# Patient Record
Sex: Female | Born: 1980 | Race: Black or African American | Hispanic: No | Marital: Single | Smoking: Current every day smoker
Health system: Southern US, Community
[De-identification: ages and names within clinical notes are randomized; demographics above are authoritative.]

---

## 2004-05-03 ENCOUNTER — Emergency Department (HOSPITAL_COMMUNITY): Admission: EM | Admit: 2004-05-03 | Discharge: 2004-05-03 | Payer: Self-pay | Admitting: Emergency Medicine

## 2004-05-11 ENCOUNTER — Emergency Department (HOSPITAL_COMMUNITY): Admission: EM | Admit: 2004-05-11 | Discharge: 2004-05-11 | Payer: Self-pay | Admitting: Emergency Medicine

## 2005-06-08 ENCOUNTER — Ambulatory Visit (HOSPITAL_COMMUNITY): Admission: RE | Admit: 2005-06-08 | Discharge: 2005-06-08 | Payer: Self-pay | Admitting: *Deleted

## 2005-07-28 ENCOUNTER — Ambulatory Visit (HOSPITAL_COMMUNITY): Admission: RE | Admit: 2005-07-28 | Discharge: 2005-07-28 | Payer: Self-pay | Admitting: *Deleted

## 2005-10-26 ENCOUNTER — Ambulatory Visit (HOSPITAL_COMMUNITY): Admission: AD | Admit: 2005-10-26 | Discharge: 2005-10-26 | Payer: Self-pay | Admitting: *Deleted

## 2005-10-28 ENCOUNTER — Inpatient Hospital Stay (HOSPITAL_COMMUNITY): Admission: AD | Admit: 2005-10-28 | Discharge: 2005-10-30 | Payer: Self-pay | Admitting: *Deleted

## 2008-08-28 ENCOUNTER — Other Ambulatory Visit: Admission: RE | Admit: 2008-08-28 | Discharge: 2008-08-28 | Payer: Self-pay | Admitting: Obstetrics and Gynecology

## 2010-09-05 ENCOUNTER — Emergency Department (HOSPITAL_COMMUNITY): Admission: EM | Admit: 2010-09-05 | Discharge: 2010-09-05 | Payer: Self-pay | Admitting: Emergency Medicine

## 2010-12-10 ENCOUNTER — Encounter: Payer: Self-pay | Admitting: *Deleted

## 2011-04-07 NOTE — Op Note (Signed)
Martha Howe, Martha Howe                 ACCOUNT NO.:  [id number]   MEDICAL RECORD NO.:  [id number]          PATIENT TYPE:  INP   LOCATION:  A401                          FACILITY:  [facility]   PHYSICIAN:  Tilda Burrow, M.D. DATE OF BIRTH:  [birth year]   DATE OF PROCEDURE:  10/28/2005  DATE OF DISCHARGE:                                 OPERATIVE REPORT   DELIVERY NOTE:  Patient progressed promptly to completely dilated.  She  pushed well, had initially some variable decelerations associated with  delivery but moved quickly to spontaneous expulsion of the vertex.  The  infant was delivered over an intact perineum, right arm delivered first and  then the infant placed on maternal abdomen.  Cord was clamped and cut near  the baby.  The baby was then transferred to the warmer at less than 1 minute  of age.  The baby initially showed good tone but while placed on the warmer  the baby's overall tone and respiratory effort diminished.  The baby had a 1  minute Apgar of 3 assigned and required positive pressure ventilation to  assist with efforts.  The baby had a 5 minute Apgar of 8 and at 10 minutes  Apgar was 9.  The baby did not require chest compressions.  During the time  of initial decline in neonatal status we called a Code Apgar to have  assistance from Emergency Room physician and Respiratory.  The baby  responded adequately to bag mask ventilation that no further efforts were  necessary.  He was continued on blow-by oxygen.  Dr. Milford Cage came in to assess,  see his notes for further details.  The mother had had the placenta  delivered promptly within 1 minute of delivery, intact __schultz  presentation, blood gases pending at the time of this dictation.  Cord blood  sample will be obtained.   ADDENDUM:  The baby's response to resuscitative efforts corresponded to  approximately a minute after receiving neonatal Narcan dose.      Tilda Burrow, M.D.  Electronically Signed     JVF/MEDQ  D:  10/28/2005  T:  10/29/2005  Job:  [id number]   cc:   Francoise Schaumann. Halford Chessman  Fax: [phone]   Langley Gauss, MD  Fax: [phone]

## 2011-04-07 NOTE — Discharge Summary (Signed)
Martha Howe, TRAUGER                 ACCOUNT NO.:  [id number]   MEDICAL RECORD NO.:  [id number]          PATIENT TYPE:  INP   LOCATION:  A401                          FACILITY:  [facility]   PHYSICIAN:  Langley Gauss, MD     DATE OF BIRTH:  1980/12/22   DATE OF ADMISSION:  10/28/2005  DATE OF DISCHARGE:  12/11/2006LH                                 DISCHARGE SUMMARY   PROCEDURES:  Patient admitted in cross coverage by Dr. Christin Bach.  Vaginal delivery performed by Dr. Christin Bach on October 28, 2005.  Infant  circumcision performed by Dr.  Langley Gauss on October 30, 2005.  Discharge services per Dr. Langley Gauss on October 30, 2005.   ADDITIONAL PROCEDURES:  The patient declined epidural, received only IV  narcotics during the course of labor.   SUMMARY:  The patient is breast feeding at the time of discharge.  Given a  copy of standardized discharge instructions.  Follow up in the office in  four weeks time for continued care.   DISCHARGE MEDICATIONS:  1.  Tylenol #3.  2.  Motrin.   PERTINENT LABORATORY DATA:  GBS carrier status negative.  Admission  hemoglobin/hematocrit 13.4/39.6 with white count of 18.4.  This was  subsequently repeated postpartum.  Hemoglobin 12.4, hematocrit 36.3 with a  white count of 22.7.  On day of discharge, October 30, 2005, hemoglobin  12.0, hematocrit 34.8, white count 15.5.  O positive blood type.   HOSPITAL COURSE:  See previous dictations.  I had planned on delivering the  patient on October 26, 2005.  However, due to staffing limitations, this was  not possible, and the patient was discharged to home.  She subsequently  returned to [facility] on October 28, 2005, in early stages of  active labor.  Labor and delivery managed by Dr. Christin Bach.  The patient  had no postpartum complications.  Was discharged home on October 30, 2005.  Infant circumcision performed by Dr. Lisette Grinder on October 30, 2005.  Dictated  separately on  same day as service.  Payments had been received prior to  performance of the said procedure in the office.      Langley Gauss, MD  Electronically Signed     DC/MEDQ  D:  10/30/2005  T:  10/31/2005  Job:  [id number]

## 2011-04-07 NOTE — Group Therapy Note (Signed)
Martha Howe, Martha Howe                 ACCOUNT NO.:  [id number]   MEDICAL RECORD NO.:  [id number]          PATIENT TYPE:  INP   LOCATION:  LDR1                          FACILITY:  [facility]   PHYSICIAN:  Tilda Burrow, M.D. DATE OF BIRTH:  [birth year]   DATE OF PROCEDURE:  DATE OF DISCHARGE:                                   PROGRESS NOTE   INTERIM NOTE:  At 3:30 p.m. the patient has progressed 8-cm dilated,  complete effaced, +2 station.  Member rupture shows clear amniotic fluid.  Fetal heart rate tracing is reactive.  The patient has multiple family  members with her and she is completely okay with this.  Amniotomy reveals  clear amniotic fluid.  Prognosis for vaginal delivery is considered  excellent.      Tilda Burrow, M.D.  Electronically Signed     JVF/MEDQ  D:  10/28/2005  T:  10/28/2005  Job:  [id number]

## 2011-04-07 NOTE — H&P (Signed)
Martha Howe, AVIS                 ACCOUNT NO.:  [id number]   MEDICAL RECORD NO.:  [id number]          PATIENT TYPE:  INP   LOCATION:  LDR2                          FACILITY:  [facility]   PHYSICIAN:  Langley Gauss, MD     DATE OF BIRTH:  11/24/1980   DATE OF ADMISSION:  10/26/2005  DATE OF DISCHARGE:  12/07/2006LH                                HISTORY & PHYSICAL   HISTORY OF PRESENT ILLNESS:  A 30 year old, G1, P0 with an EDC of November 05, 2005, who is seen in the office today on two occasions complaining of  increasing frequency and intensity of uterine contractions.  The patient has  been examined x2 in the office with some cervical change.  She is noted to  be 3 cm dilated, completely effaced, vertex at 0 station and well-applied to  the cervix.  She does complain of uterine contractions every 5-10 minutes.   PRENATAL COURSE:  Complicated by early findings per [facility] sonogram of  July 21, 2005, and was felt to be polyhydramnios, however, subsequent  targeted ultrasound at [facility] on August 25, 2005, revealed normal amniotic  fluid volume.  The remainder of anatomic survey was within normal limits.  She did have a right ovarian cyst present during first trimester ultrasounds  which subsequently resolved.  The patient was noted to have trichomonas on  initial Pap smear and this was treated during the second trimester.  She  otherwise has had serial ultrasounds which documents adequate fetal growth.  She smoked only about 3 cigarettes per day with O positive blood type,  antibody screen negative.  One-hour GTT is normal at 86.   SOCIAL HISTORY:  The patient is employed at [employer].  Father of the  baby, Ventura Sellers also employed at [employer].   PAST MEDICAL HISTORY:  No other medical or surgical history.  She does plan  on breast-feeding.  She will be utilizing pediatrician on call.   PHYSICAL EXAMINATION:  GENERAL:  She is noted to be in mild distress with  uterine contractions.  VITAL SIGNS:  Height 5 feet 6 inches.  Pregnancy weight 135.  Most recent  weight 179.  Blood pressure 119/74, pulse rate 80, respirations 20.  HEENT:  Negative.  No adenopathy.  NECK:  Supple.  Thyroid is nonpalpable.  LUNGS:  Clear.  CARDIAC:  Regular rate and rhythm.  ABDOMEN:  Soft and nontender.  Gravid uterus is identified with a fundal  height of 36 cm.  She is vertex presentation by Thayer Ohm maneuver.  EXTREMITIES:  Normal.  PELVIC:  Normal external genitalia.  No lesions or ulcerations identified.  No vaginal bleeding.  No leakage of fluid.  Cervix is 3 cm dilated,  completely effaced.  Vertex at a 0 station and well-applied.  Fetal heart  tones auscultated in the 150s.  GBS carrier status is noted to be negative.   ASSESSMENT/PLAN:  The patient in probable early stages of labor.  She is  referred to [facility] for monitoring for either cervical change or  for increasing frequency in intensity of  uterine contractions.      Langley Gauss, MD  Electronically Signed     DC/MEDQ  D:  10/26/2005  T:  10/26/2005  Job:  [id number]

## 2011-04-07 NOTE — H&P (Signed)
Martha Howe, Martha Howe                 ACCOUNT NO.:  [id number]   MEDICAL RECORD NO.:  [id number]          PATIENT TYPE:  INP   LOCATION:  LDR1                          FACILITY:  [facility]   PHYSICIAN:  Tilda Burrow, M.D. DATE OF BIRTH:  Apr 06, 1981   DATE OF ADMISSION:  10/28/2005  DATE OF DISCHARGE:  LH                                HISTORY & PHYSICAL   ADMISSION DIAGNOSIS:  Pregnancy, 38+ weeks gestation, latent phase labor.   HISTORY OF PRESENT ILLNESS:  This 30 year old primiparous female, last  menstrual period _________, now at 38-1/[redacted] weeks gestation by consensus  criteria is admitted after pregnancy course followed through Dr. Preston Fleeting  office. She was [facility]  at [redacted] weeks gestation where fetal survey was normal with detailed survey  performed with no outstanding abnormalities noted. She had normal amniotic  fluid volume with anterior placenta. She had MSAFP for Down syndrome risk  1:2,100, group B strep negative, glucose tolerance test normal at 86 mg/%.  Female infant. She was originally going to be scheduled for elective induction  next week. She is admitted for labor after presenting with discomfort  through the night. No rupture of membranes or bleeding with cervical exam of  3 cm, 100% effaced, -1 station by nursing evaluation. She is admitted for  labor and management.   PAST MEDICAL HISTORY:  See accompanying prenatal records.   ALLERGIES:  No known drug allergies.   PHYSICAL EXAMINATION:  GENERAL:  Reveals a tall, slim, African-American  female, alert and oriented x3.  HEENT:  Pupils are equal, round, and reactive. Extraocular movements intact.  NECK:  Supple. Trachea midline.  ABDOMEN:  Nontender, gravid uterus, appropriate for term gestation.  CERVIX:  3 cm, 100%, -1 by nursing evaluation.   PLAN:  Allow to labor. May require Pitocin augmentation. Good prognosis for  vaginal delivery.      Tilda Burrow,  M.D.  Electronically Signed     JVF/MEDQ  D:  10/28/2005  T:  10/28/2005  Job:  [id number]   cc:   [facility]

## 2013-05-23 ENCOUNTER — Encounter (HOSPITAL_COMMUNITY): Payer: Self-pay

## 2013-05-23 ENCOUNTER — Emergency Department (HOSPITAL_COMMUNITY): Payer: Medicaid Other

## 2013-05-23 ENCOUNTER — Emergency Department (HOSPITAL_COMMUNITY)
Admission: EM | Admit: 2013-05-23 | Discharge: 2013-05-23 | Disposition: A | Discharge status: Home / Self Care | Payer: Medicaid Other | Attending: Emergency Medicine | Admitting: Emergency Medicine

## 2013-05-23 DIAGNOSIS — F17200 Nicotine dependence, unspecified, uncomplicated: Secondary | ICD-10-CM | POA: Insufficient documentation

## 2013-05-23 DIAGNOSIS — IMO0002 Reserved for concepts with insufficient information to code with codable children: Secondary | ICD-10-CM | POA: Insufficient documentation

## 2013-05-23 DIAGNOSIS — R319 Hematuria, unspecified: Secondary | ICD-10-CM | POA: Insufficient documentation

## 2013-05-23 DIAGNOSIS — N390 Urinary tract infection, site not specified: Secondary | ICD-10-CM | POA: Insufficient documentation

## 2013-05-23 DIAGNOSIS — R300 Dysuria: Secondary | ICD-10-CM | POA: Insufficient documentation

## 2013-05-23 DIAGNOSIS — Z3202 Encounter for pregnancy test, result negative: Secondary | ICD-10-CM | POA: Insufficient documentation

## 2013-05-23 DIAGNOSIS — R350 Frequency of micturition: Secondary | ICD-10-CM | POA: Insufficient documentation

## 2013-05-23 LAB — PREGNANCY, URINE: Preg Test, Ur: NEGATIVE

## 2013-05-23 LAB — URINE MICROSCOPIC-ADD ON

## 2013-05-23 LAB — URINALYSIS, ROUTINE W REFLEX MICROSCOPIC
Bilirubin Urine: NEGATIVE
Glucose, UA: NEGATIVE mg/dL
Leukocytes, UA: NEGATIVE
Nitrite: NEGATIVE
Specific Gravity, Urine: 1.025 (ref 1.005–1.030)
Urobilinogen, UA: 1 mg/dL (ref 0.0–1.0)
pH: 6.5 (ref 5.0–8.0)

## 2013-05-23 MED ORDER — PHENAZOPYRIDINE HCL 100 MG PO TABS
200.0000 mg | ORAL_TABLET | Freq: Once | ORAL | Status: AC
  Administered 2013-05-23: 200 mg via ORAL
  Filled 2013-05-23: qty 2

## 2013-05-23 MED ORDER — CEPHALEXIN 500 MG PO CAPS: 500.0000 mg | ORAL_CAPSULE | Freq: Four times a day (QID) | ORAL | Status: DC

## 2013-05-23 MED ORDER — CEPHALEXIN 500 MG PO CAPS
500.0000 mg | ORAL_CAPSULE | Freq: Once | ORAL | Status: AC
  Administered 2013-05-23: 500 mg via ORAL
  Filled 2013-05-23: qty 1

## 2013-05-23 MED ORDER — PHENAZOPYRIDINE HCL 200 MG PO TABS: 200.0000 mg | ORAL_TABLET | Freq: Three times a day (TID) | ORAL | Status: DC

## 2013-05-23 NOTE — ED Notes (Signed)
Pt was on depo injections, due for injection April 4th and did not get another, has not had a regular period since that time. She has been sexually active.

## 2013-05-23 NOTE — ED Provider Notes (Signed)
History  This chart was scribed for Lyanne Co, MD by Bennett Scrape, ED Scribe. This patient was seen in room APA17/APA17 and the patient's care was started at 9:09 AM.  CSN: [id number]  Arrival date & time 05/23/13  0905   First MD Initiated Contact with Patient 05/23/13 [phone]     Chief Complaint  Patient presents with  . Urinary Tract Infection    The history is provided by the patient. No language interpreter was used.    HPI Comments: Martha Howe is a 32 y.o. female who presents to the Emergency Department complaining of 2 days of lower back pain that radiates to the suprapubic region with associated increased frequency, hematuria described as light pink, dyspareunia and dysuria described as pressure. She states that the urinary symptoms started before she had intercourse. She denies having a h/o UTIs or prior episodes of the same. She denies fever, chills, nausea, emesis, vaginal lesions and vaginal bleeding or discharge as associated symptoms.  History reviewed. No pertinent past medical history.  History reviewed. No pertinent past surgical history.  No family history on file.  History  Substance Use Topics  . Smoking status: Current Every Day Smoker    Types: Cigarettes  . Smokeless tobacco: Not on file  . Alcohol Use: Yes     Comment: occ   No OB history provided.   Review of Systems  A complete 10 system review of systems was obtained and all systems are negative except as noted in the HPI and PMH.   Allergies  Review of patient's allergies indicates not on file.  Home Medications  No current outpatient prescriptions on file.  LMP 04/21/2013  Physical Exam  Nursing note and vitals reviewed. Constitutional: She is oriented to person, place, and time. She appears well-developed and well-nourished. No distress.  HENT:  Head: Normocephalic and atraumatic.  Eyes: Conjunctivae and EOM are normal.  Neck: Normal range of motion. No tracheal deviation  present.  Cardiovascular: Normal rate, regular rhythm and normal heart sounds.   No murmur heard. Pulmonary/Chest: Effort normal and breath sounds normal.  Abdominal: Soft. She exhibits no distension. There is no tenderness.  Musculoskeletal: Normal range of motion.  Neurological: She is alert and oriented to person, place, and time.  Skin: Skin is warm and dry.  Psychiatric: She has a normal mood and affect. Judgment normal.    ED Course  Procedures (including critical care time)  DIAGNOSTIC STUDIES: None performed  COORDINATION OF CARE: 9:22 AM-Discussed treatment plan which includes pyridium and antibiotics with pt at bedside and pt agreed to plan.   Labs Reviewed  URINALYSIS, ROUTINE W REFLEX MICROSCOPIC - Abnormal; Notable for the following:    Hgb urine dipstick MODERATE (*)    Ketones, ur TRACE (*)    Protein, ur TRACE (*)    All other components within normal limits  URINE MICROSCOPIC-ADD ON - Abnormal; Notable for the following:    Squamous Epithelial / LPF MANY (*)    Bacteria, UA FEW (*)    Casts GRANULAR CAST (*)    All other components within normal limits  PREGNANCY, URINE   US Renal  05/23/2013   *RADIOLOGY REPORT*  Clinical Data:  Urinary tract infection  RENAL/URINARY TRACT ULTRASOUND COMPLETE  Comparison:  Obstetrical ultrasound 07/28/2005  Findings:  Right Kidney:  Normal in size (12.1 cm) and parenchymal echogenicity.  No evidence of mass or hydronephrosis.  Left Kidney:  Normal in size (12.8 cm) and parenchymal echogenicity.  No  evidence of mass or hydronephrosis.  Bladder:  Not well seen secondary to under distension.  IMPRESSION:  1.  Normal sonographic appearance of the kidneys. 2.  The bladder is not well seen secondary to under distension.   Original Report Authenticated By: Malachy Moan, M.D.   1. Urinary frequency   2. Hematuria      MDM  Given the patient's hematuria and ultrasound was obtained because demonstrates no evidence of  hydronephrosis or obvious ureteral stone.  Her symptoms sound more consistent with urinary tract infection therefore urine culture was sent the patient be placed on Keflex for 5 days  I personally performed the services described in this documentation, which was scribed in my presence. The recorded information has been reviewed and is accurate.      Lyanne Co, MD 05/23/13 [phone]

## 2013-05-23 NOTE — ED Notes (Signed)
Pt reports low back pain, freq. Urination, and blood in her urine for 2 days, no vaginal discharge, no fever, no nausea.  No having bil lower ab pain.

## 2013-07-25 ENCOUNTER — Encounter (HOSPITAL_COMMUNITY): Payer: Self-pay | Admitting: Emergency Medicine

## 2013-07-25 ENCOUNTER — Emergency Department (HOSPITAL_COMMUNITY)
Admission: EM | Admit: 2013-07-25 | Discharge: 2013-07-25 | Disposition: A | Discharge status: Home / Self Care | Payer: Medicaid Other | Attending: Emergency Medicine | Admitting: Emergency Medicine

## 2013-07-25 DIAGNOSIS — B9689 Other specified bacterial agents as the cause of diseases classified elsewhere: Secondary | ICD-10-CM | POA: Insufficient documentation

## 2013-07-25 DIAGNOSIS — A499 Bacterial infection, unspecified: Secondary | ICD-10-CM | POA: Insufficient documentation

## 2013-07-25 DIAGNOSIS — N760 Acute vaginitis: Secondary | ICD-10-CM | POA: Insufficient documentation

## 2013-07-25 DIAGNOSIS — N898 Other specified noninflammatory disorders of vagina: Secondary | ICD-10-CM | POA: Insufficient documentation

## 2013-07-25 DIAGNOSIS — F17200 Nicotine dependence, unspecified, uncomplicated: Secondary | ICD-10-CM | POA: Insufficient documentation

## 2013-07-25 LAB — WET PREP, GENITAL: Trich, Wet Prep: NONE SEEN

## 2013-07-25 LAB — URINALYSIS, ROUTINE W REFLEX MICROSCOPIC
Bilirubin Urine: NEGATIVE
Glucose, UA: NEGATIVE mg/dL
Protein, ur: NEGATIVE mg/dL
pH: 6 (ref 5.0–8.0)

## 2013-07-25 LAB — RPR: RPR Ser Ql: NONREACTIVE

## 2013-07-25 MED ORDER — METRONIDAZOLE 500 MG PO TABS: 500.0000 mg | ORAL_TABLET | Freq: Two times a day (BID) | ORAL | Status: DC

## 2013-07-25 MED ORDER — HYDROCODONE-ACETAMINOPHEN 5-325 MG PO TABS: 1.0000 | ORAL_TABLET | ORAL | Status: DC | PRN

## 2013-07-25 NOTE — ED Notes (Signed)
Pt c/o vaginal itching/burning x 3 days. Would like to be tested for STD also. C/o white cloudy vag d/c. Took monostat 3. Yesterday was 2nd day. nad

## 2013-07-25 NOTE — ED Notes (Signed)
Patient with no complaints at this time. Respirations even and unlabored. Skin warm/dry. Discharge instructions reviewed with patient at this time. Patient given opportunity to voice concerns/ask questions. Patient discharged at this time and left Emergency Department with steady gait.   

## 2013-07-25 NOTE — ED Provider Notes (Signed)
CSN: [id number]     Arrival date & time 07/25/13  4098 History   First MD Initiated Contact with Patient 07/25/13 [phone]     Chief Complaint  Patient presents with  . Vaginal Itching   (Consider location/radiation/quality/duration/timing/severity/associated sxs/prior Treatment) Patient is a 32 y.o. female presenting with vaginal itching. The history is provided by the patient.  Vaginal Itching This is a new problem. The current episode started in the past 7 days. The problem occurs daily. The problem has been gradually worsening. Pertinent negatives include no abdominal pain, arthralgias, chest pain, coughing, fever or neck pain. Nothing aggravates the symptoms. Treatments tried: monistat. The treatment provided no relief.    History reviewed. No pertinent past medical history. History reviewed. No pertinent past surgical history. History reviewed. No pertinent family history. History  Substance Use Topics  . Smoking status: Current Every Day Smoker    Types: Cigarettes  . Smokeless tobacco: Not on file  . Alcohol Use: Yes     Comment: occ   OB History   Grav Para Term Preterm Abortions TAB SAB Ect Mult Living                 Review of Systems  Constitutional: Negative for fever and activity change.       All ROS Neg except as noted in HPI  HENT: Negative for nosebleeds and neck pain.   Eyes: Negative for photophobia and discharge.  Respiratory: Negative for cough, shortness of breath and wheezing.   Cardiovascular: Negative for chest pain and palpitations.  Gastrointestinal: Negative for abdominal pain and blood in stool.  Genitourinary: Positive for vaginal discharge. Negative for dysuria, frequency, hematuria, decreased urine volume and difficulty urinating.  Musculoskeletal: Negative for back pain and arthralgias.  Skin: Negative.   Neurological: Negative for dizziness, seizures and speech difficulty.  Psychiatric/Behavioral: Negative for hallucinations and confusion.     Allergies  Review of patient's allergies indicates no known allergies.  Home Medications   Current Outpatient Rx  Name  Route  Sig  Dispense  Refill  . Miconazole Nitrate (MONISTAT 3 COMBINATION PACK VA)   Vaginal   Place 1 application vaginally See admin instructions. Takes for 3 days, pt will be taking last dose tonight 9/5          BP 104/61  Pulse 88  Temp(Src) 97.9 F (36.6 C) (Oral)  SpO2 98%  LMP 06/30/2013 Physical Exam  Nursing note and vitals reviewed. Constitutional: She is oriented to person, place, and time. She appears well-developed and well-nourished.  Non-toxic appearance.  HENT:  Head: Normocephalic.  Right Ear: Tympanic membrane and external ear normal.  Left Ear: Tympanic membrane and external ear normal.  Eyes: EOM and lids are normal. Pupils are equal, round, and reactive to light.  Neck: Normal range of motion. Neck supple. Carotid bruit is not present.  Cardiovascular: Normal rate, regular rhythm, normal heart sounds, intact distal pulses and normal pulses.   Pulmonary/Chest: Breath sounds normal. No respiratory distress.  Abdominal: Soft. Bowel sounds are normal. There is no tenderness. There is no guarding.  Genitourinary:  Chaperone present during examination. No external lesions of the pubis or external vagina. There is increased redness and swelling of the internal labia and to the entrance of the vagina. There is no foreign body appreciated. There is noted some residue from the Monistat that the patient has been using. There is a white milky  discharge present. The os of the cervix is closed there is increase redness present.  There is no wall motion tenderness. There is no adnexal tenderness or mass appreciated.  Musculoskeletal: Normal range of motion.  Lymphadenopathy:       Head (right side): No submandibular adenopathy present.       Head (left side): No submandibular adenopathy present.    She has no cervical adenopathy.  Neurological:  She is alert and oriented to person, place, and time. She has normal strength. No cranial nerve deficit or sensory deficit.  Skin: Skin is warm and dry.  Psychiatric: She has a normal mood and affect. Her speech is normal.    ED Course  Procedures (including critical care time) Labs Review Labs Reviewed  URINALYSIS, ROUTINE W REFLEX MICROSCOPIC - Abnormal; Notable for the following:    Hgb urine dipstick SMALL (*)    Ketones, ur TRACE (*)    Leukocytes, UA TRACE (*)    All other components within normal limits  URINE MICROSCOPIC-ADD ON   Imaging Review No results found.  MDM  No diagnosis found. *I have reviewed nursing notes, vital signs, and all appropriate lab and imaging results for this patient.** UA non-acute. Wet prep reveal many clue cell and few WBC's c/w bacterial vaginosis. Rx for flagyl given to the patient.  Kathie Dike, PA-C 07/28/13 1656

## 2013-07-26 LAB — GC/CHLAMYDIA PROBE AMP
CT Probe RNA: NEGATIVE
GC Probe RNA: NEGATIVE

## 2013-07-29 NOTE — ED Provider Notes (Signed)
Medical screening examination/treatment/procedure(s) were performed by non-physician practitioner and as supervising physician I was immediately available for consultation/collaboration.    [place] Bryngelson D Avianna Moynahan, MD 07/29/13 1149 

## 2014-06-08 ENCOUNTER — Encounter (HOSPITAL_COMMUNITY): Payer: Self-pay | Admitting: Emergency Medicine

## 2014-06-08 ENCOUNTER — Emergency Department (HOSPITAL_COMMUNITY)
Admission: EM | Admit: 2014-06-08 | Discharge: 2014-06-08 | Disposition: A | Discharge status: Home / Self Care | Payer: Medicaid Other | Attending: Emergency Medicine | Admitting: Emergency Medicine

## 2014-06-08 DIAGNOSIS — A499 Bacterial infection, unspecified: Secondary | ICD-10-CM | POA: Insufficient documentation

## 2014-06-08 DIAGNOSIS — O99330 Smoking (tobacco) complicating pregnancy, unspecified trimester: Secondary | ICD-10-CM | POA: Insufficient documentation

## 2014-06-08 DIAGNOSIS — A5901 Trichomonal vulvovaginitis: Secondary | ICD-10-CM | POA: Diagnosis not present

## 2014-06-08 DIAGNOSIS — O26859 Spotting complicating pregnancy, unspecified trimester: Secondary | ICD-10-CM | POA: Diagnosis present

## 2014-06-08 DIAGNOSIS — B9689 Other specified bacterial agents as the cause of diseases classified elsewhere: Secondary | ICD-10-CM | POA: Insufficient documentation

## 2014-06-08 DIAGNOSIS — N390 Urinary tract infection, site not specified: Secondary | ICD-10-CM | POA: Diagnosis not present

## 2014-06-08 DIAGNOSIS — N760 Acute vaginitis: Secondary | ICD-10-CM | POA: Diagnosis not present

## 2014-06-08 DIAGNOSIS — O2343 Unspecified infection of urinary tract in pregnancy, third trimester: Secondary | ICD-10-CM

## 2014-06-08 DIAGNOSIS — A599 Trichomoniasis, unspecified: Secondary | ICD-10-CM

## 2014-06-08 DIAGNOSIS — O2390 Unspecified genitourinary tract infection in pregnancy, unspecified trimester: Secondary | ICD-10-CM | POA: Diagnosis not present

## 2014-06-08 LAB — URINALYSIS, ROUTINE W REFLEX MICROSCOPIC
Bilirubin Urine: NEGATIVE
Glucose, UA: NEGATIVE mg/dL
Hgb urine dipstick: NEGATIVE
KETONES UR: NEGATIVE mg/dL
NITRITE: NEGATIVE
PH: 6 (ref 5.0–8.0)
SPECIFIC GRAVITY, URINE: 1.025 (ref 1.005–1.030)
UROBILINOGEN UA: 1 mg/dL (ref 0.0–1.0)

## 2014-06-08 LAB — WET PREP, GENITAL: Yeast Wet Prep HPF POC: NONE SEEN

## 2014-06-08 LAB — URINE MICROSCOPIC-ADD ON

## 2014-06-08 MED ORDER — AZITHROMYCIN 250 MG PO TABS
1000.0000 mg | ORAL_TABLET | Freq: Once | ORAL | Status: AC
Start: 2014-06-08 — End: 2014-06-08
  Administered 2014-06-08: 1000 mg via ORAL
  Filled 2014-06-08: qty 4

## 2014-06-08 MED ORDER — METRONIDAZOLE 500 MG PO TABS: ORAL_TABLET | ORAL | Status: DC

## 2014-06-08 MED ORDER — ONDANSETRON HCL 4 MG PO TABS
4.0000 mg | ORAL_TABLET | Freq: Once | ORAL | Status: AC
  Administered 2014-06-08: 4 mg via ORAL
  Filled 2014-06-08: qty 1

## 2014-06-08 MED ORDER — METRONIDAZOLE 500 MG PO TABS
500.0000 mg | ORAL_TABLET | Freq: Once | ORAL | Status: AC
  Administered 2014-06-08: 500 mg via ORAL
  Filled 2014-06-08: qty 1

## 2014-06-08 MED ORDER — STERILE WATER FOR INJECTION IJ SOLN
INTRAMUSCULAR | Status: AC
  Administered 2014-06-08: 10 mL
  Filled 2014-06-08: qty 10

## 2014-06-08 MED ORDER — CEFTRIAXONE SODIUM 250 MG IJ SOLR
250.0000 mg | Freq: Once | INTRAMUSCULAR | Status: AC
  Administered 2014-06-08: 250 mg via INTRAMUSCULAR
  Filled 2014-06-08: qty 250

## 2014-06-08 NOTE — Discharge Instructions (Signed)
Bacterial Vaginosis °Bacterial vaginosis is a vaginal infection that occurs when the normal balance of bacteria in the vagina is disrupted. It results from an overgrowth of certain bacteria. This is the most common vaginal infection in women of childbearing age. Treatment is important to prevent complications, especially in pregnant women, as it can cause a premature delivery. °CAUSES  °Bacterial vaginosis is caused by an increase in harmful bacteria that are normally present in smaller amounts in the vagina. Several different kinds of bacteria can cause bacterial vaginosis. However, the reason that the condition develops is not fully understood. °RISK FACTORS °Certain activities or behaviors can put you at an increased risk of developing bacterial vaginosis, including: °· Having a new sex partner or multiple sex partners. °· Douching. °· Using an intrauterine device (IUD) for contraception. °Women do not get bacterial vaginosis from toilet seats, bedding, swimming pools, or contact with objects around them. °SIGNS AND SYMPTOMS  °Some women with bacterial vaginosis have no signs or symptoms. Common symptoms include: °· Grey vaginal discharge. °· A fishlike odor with discharge, especially after sexual intercourse. °· Itching or burning of the vagina and vulva. °· Burning or pain with urination. °DIAGNOSIS  °Your health care provider will take a medical history and examine the vagina for signs of bacterial vaginosis. A sample of vaginal fluid may be taken. Your health care provider will look at this sample under a microscope to check for bacteria and abnormal cells. A vaginal pH test may also be done.  °TREATMENT  °Bacterial vaginosis may be treated with antibiotic medicines. These may be given in the form of a pill or a vaginal cream. A second round of antibiotics may be prescribed if the condition comes back after treatment.  °HOME CARE INSTRUCTIONS  °· Only take over-the-counter or prescription medicines as  directed by your health care provider. °· If antibiotic medicine was prescribed, take it as directed. Make sure you finish it even if you start to feel better. °· Do not have sex until treatment is completed. °· Tell all sexual partners that you have a vaginal infection. They should see their health care provider and be treated if they have problems, such as a mild rash or itching. °· Practice safe sex by using condoms and only having one sex partner. °SEEK MEDICAL CARE IF:  °· Your symptoms are not improving after 3 days of treatment. °· You have increased discharge or pain. °· You have a fever. °MAKE SURE YOU:  °· Understand these instructions. °· Will watch your condition. °· Will get help right away if you are not doing well or get worse. °FOR MORE INFORMATION  °Centers for Disease Control and Prevention, Division of STD Prevention: www.cdc.gov/std °American Sexual Health Association (ASHA): www.ashastd.org  °Document Released: 11/06/2005 Document Revised: 08/27/2013 Document Reviewed: 06/18/2013 °ExitCare® Patient Information ©2015 [employer]. This information is not intended to replace advice given to you by your health care provider. Make sure you discuss any questions you have with your health care provider. °Trichomoniasis °Trichomoniasis is an infection caused by an organism called Trichomonas. The infection can affect both women and men. In women, the outer female genitalia and the vagina are affected. In men, the penis is mainly affected, but the prostate and other reproductive organs can also be involved. Trichomoniasis is a sexually transmitted infection (STI) and is most often passed to another person through sexual contact.  °RISK FACTORS °· Having unprotected sexual intercourse. °· Having sexual intercourse with an infected partner. °SIGNS AND SYMPTOMS  °  Symptoms of trichomoniasis in women include: °· Abnormal gray-green frothy vaginal discharge. °· Itching and irritation of the  vagina. °· Itching and irritation of the area outside the vagina. °Symptoms of trichomoniasis in men include:  °· Penile discharge with or without pain. °· Pain during urination. This results from inflammation of the urethra. °DIAGNOSIS  °Trichomoniasis may be found during a Pap test or physical exam. Your health care provider may use one of the following methods to help diagnose this infection: °· Examining vaginal discharge under a microscope. For men, urethral discharge would be examined. °· Testing the pH of the vagina with a test tape. °· Using a vaginal swab test that checks for the Trichomonas organism. A test is available that provides results within a few minutes. °· Doing a culture test for the organism. This is not usually needed. °TREATMENT  °· You may be given medicine to fight the infection. Women should inform their health care provider if they could be or are pregnant. Some medicines used to treat the infection should not be taken during pregnancy. °· Your health care provider may recommend over-the-counter medicines or creams to decrease itching or irritation. °· Your sexual partner will need to be treated if infected. °HOME CARE INSTRUCTIONS  °· Take all medicine prescribed by your health care provider. °· Take over-the-counter medicine for itching or irritation as directed by your health care provider. °· Do not have sexual intercourse while you have the infection. °· Women should not douche or wear tampons while they have the infection. °· Discuss your infection with your partner. Your partner may have gotten the infection from you, or you may have gotten it from your partner. °· Have your sex partner get examined and treated if necessary. °· Practice safe, informed, and protected sex. °· See your health care provider for other STI testing. °SEEK MEDICAL CARE IF:  °· You still have symptoms after you finish your medicine. °· You develop abdominal pain. °· You have pain when you urinate. °· You  have bleeding after sexual intercourse. °· You develop a rash. °· Your medicine makes you sick or makes you throw up (vomit). °Document Released: 05/02/2001 Document Revised: 11/11/2013 Document Reviewed: 08/18/2013 °ExitCare® Patient Information ©2015 [employer]. This information is not intended to replace advice given to you by your health care provider. Make sure you discuss any questions you have with your health care provider. ° °

## 2014-06-08 NOTE — ED Provider Notes (Signed)
CSN: [id number]     Arrival date & time 06/08/14  1533 History   None   This chart was scribed for non-physician practitioner Ivery Quale, PA-C working with No att. providers found, by Andrew Au, ED Scribe. This patient was seen in room APFT22/APFT22 and the patient's care was started at 6:08 PM.  Chief Complaint  Patient presents with  . Vaginal Discharge   Patient is a 33 y.o. female presenting with vaginal discharge. The history is provided by the patient. No language interpreter was used.  Vaginal Discharge Associated symptoms: no dysuria and no fever    Martha Howe is a 33 y.o. female who is 24-[redacted] weeks pregnant presents to the Emergency Department complaining of yellow/ greenish foul smelling vaginal discharge that began 2 days ago. Pt reports irritation to vaginal area when using flushable wipes. Pt reports she called OBGYN who believes her symptoms are a bacterial infection. She reports due to GYN being far she decieded to be seen at the ED. She reports last GYN evaluation was the beginning of this month which was normal. Pt reports h/o of yeast infection and denies similar symptoms. Pt denies fever and change in urination.    History reviewed. No pertinent past medical history. History reviewed. No pertinent past surgical history. History reviewed. No pertinent family history. History  Substance Use Topics  . Smoking status: Current Every Day Smoker    Types: Cigarettes  . Smokeless tobacco: Not on file  . Alcohol Use: Yes     Comment: occ   OB History   Grav Para Term Preterm Abortions TAB SAB Ect Mult Living   1              Review of Systems  Constitutional: Negative for fever and chills.  Genitourinary: Positive for vaginal discharge. Negative for dysuria, urgency, hematuria, decreased urine volume and difficulty urinating.   Allergies  Review of patient's allergies indicates no known allergies.  Home Medications   Prior to Admission medications   Not on  File   Triage Vitals- BP 106/63  Pulse 111  Temp(Src) 98 F (36.7 C) (Oral)  Resp 16  Ht 5\' 7"  (1.702 m)  Wt 172 lb (78.019 kg)  BMI 26.93 kg/m2  SpO2 100%  LMP 06/30/2013 Physical Exam  Nursing note and vitals reviewed. Constitutional: She is oriented to person, place, and time. She appears well-developed and well-nourished. No distress.  HENT:  Head: Normocephalic and atraumatic.  Eyes: Conjunctivae and EOM are normal.  Neck: Neck supple.  Cardiovascular: Normal rate, regular rhythm and normal heart sounds.  Exam reveals no gallop and no friction rub.   No murmur heard. Pulmonary/Chest: Effort normal and breath sounds normal. No respiratory distress. She has no wheezes. She has no rales. She exhibits no tenderness.  Genitourinary:  Chaperon present. External structures within normal limits. No rash or fistula noted. No foreign body in vaginal vault. Thick yellow green discharge in vaginal vault and from the os of the cervix. No wall motion tenderness. No adnexal mass.  Small healing abscess of the right inner thigh. minimal drainage. No red streaking.   Musculoskeletal: Normal range of motion.  Neurological: She is alert and oriented to person, place, and time.  Skin: Skin is warm and dry.  Psychiatric: She has a normal mood and affect. Her behavior is normal.    ED Course  Procedures (including critical care time) DIAGNOSTIC STUDIES: Oxygen Saturation is 100% on RA, normal by my interpretation.    COORDINATION  OF CARE: 6:20 PM- Pt advised of plan for treatment which includesWet prep, GC chlamydia. UA and abscess culture sent to the lab.    Labs Review Labs Reviewed - No data to display  Imaging Review No results found.   EKG Interpretation None      MDM The heart rate is elevated at 111. The remainder the vital signs are well within normal limits. The pulse oximetry 100% on room air. Within normal limits by my interpretation. The wet prep shows evidence of  Trichomonas, bacterial vaginosis. The urinalysis reveals a urinary tract infection. The patient was treated in the emergency department with Rocephin, Zithromax, and Flagyl. Prescription for Flagyl given to the patient with instructions to take this medication with food, and to refrain from all alcohol related substances. Patient is to follow with GYN physician for additional evaluation and recheck. Patient also encouraged to have her partner evaluated. Patient encouraged to refrain from sexual activity until this issue has been resolved.    Final diagnoses:  None    I personally performed the services described in this documentation, which was scribed in my presence. The recorded information has been reviewed and is accurate.      Kathie DikeHobson M Ronda Kazmi, PA-C 06/10/14 [phone]

## 2014-06-08 NOTE — Progress Notes (Signed)
Spoke with StatisticianCrystal RN. Okay to fast track pt since she only has GYN complaints and no OB complaints.The FHR will need to be obtained by doppler before she leaves and she is to follow up with her [facility] this week.

## 2014-06-08 NOTE — ED Notes (Addendum)
Pt c/o of yellow/greenish foul smelling watery discharge constantly for last 2 days. Pt spoke with OB today, who informed her to come to ED. Patient is [redacted] weeks pregnant. Pt reports "normal" fetal activity.

## 2014-06-08 NOTE — Progress Notes (Signed)
Spoke with Dr. Erin FullingHarraway-Smith, [facility] Attending on call. Since the pt only has GYN complaints,she is agreeable to obtaining FHR by doppler, doing cultures, and then obtaining the FHR by doppler before she is d/c. The pt [facility]

## 2014-06-08 NOTE — Progress Notes (Signed)
Received call from [facility] RN, Crystal. Pt is 24-[redacted] weeks pregnant with c/o yellow-green vaginal d/c. No vag bleeding, leaking of fluid, or uc's per pt. FHR is 151 BPM by doppler. Pt is [facility] pt. Crystal says that they do not have access to a FM at this time and the PA wants to Fast Track the pt. States they will do a pelvic and cultures. I will call the OB attending to ask about the need to place the pt on the monitor.

## 2014-06-10 LAB — URINE CULTURE
CULTURE: NO GROWTH
Colony Count: NO GROWTH

## 2014-06-10 LAB — GC/CHLAMYDIA PROBE AMP
CT Probe RNA: NEGATIVE
GC Probe RNA: NEGATIVE

## 2014-06-10 NOTE — ED Provider Notes (Signed)
Medical screening examination/treatment/procedure(s) were performed by non-physician practitioner and as supervising physician I was immediately available for consultation/collaboration.   EKG Interpretation None        Alexea Blase F Jermie Hippe, MD 06/10/14 0943 

## 2014-06-11 LAB — CULTURE, ROUTINE-ABSCESS: GRAM STAIN: NONE SEEN

## 2014-06-12 NOTE — Progress Notes (Signed)
ED Antimicrobial Stewardship Positive Culture Follow Up   Christianne BorrowLavinia M Crittendon is an 33 y.o. female who [facility] with a chief complaint of  Chief Complaint  Patient presents with  . Vaginal Discharge    Recent Results (from the past 720 hour(s))  WET PREP, GENITAL     Status: Abnormal   Collection Time    06/08/14  7:24 PM      Result Value Ref Range Status   Yeast Wet Prep HPF POC NONE SEEN  NONE SEEN Final   Trich, Wet Prep FEW (*) NONE SEEN Final   Clue Cells Wet Prep HPF POC FEW (*) NONE SEEN Final   WBC, Wet Prep HPF POC MANY (*) NONE SEEN Final  GC/CHLAMYDIA PROBE AMP     Status: None   Collection Time    06/08/14  7:24 PM      Result Value Ref Range Status   CT Probe RNA NEGATIVE  NEGATIVE Final   GC Probe RNA NEGATIVE  NEGATIVE Final   Comment: (NOTE)                                                                                               **Normal Reference Range: Negative**          Assay performed using the Gen-Probe APTIMA COMBO2 (R) Assay.     Acceptable specimen types for this assay include APTIMA Swabs (Unisex,     endocervical, urethral, or vaginal), first void urine, and ThinPrep     liquid based cytology samples.     Performed at [facility]  CULTURE, ROUTINE-ABSCESS     Status: None   Collection Time    06/08/14  7:24 PM      Result Value Ref Range Status   Specimen Description ABSCESS RIGHT GROIN   Final   Special Requests NONE   Final   Gram Stain     Final   Value: NO WBC SEEN     RARE SQUAMOUS EPITHELIAL CELLS PRESENT     RARE GRAM POSITIVE COCCI     IN PAIRS     Performed at [facility]   Culture     Final   Value: ABUNDANT GROUP B STREP(S.AGALACTIAE)ISOLATED     Note: TESTING AGAINST S. AGALACTIAE NOT ROUTINELY PERFORMED DUE TO PREDICTABILITY OF AMP/PEN/VAN SUSCEPTIBILITY.     Performed at [facility]   Report Status 06/11/2014 FINAL   Final  URINE CULTURE     Status: None   Collection Time     06/08/14  8:14 PM      Result Value Ref Range Status   Specimen Description URINE, CLEAN CATCH   Final   Special Requests NONE   Final   Culture  Setup Time     Final   Value: 06/09/2014 13:40     Performed at [facility]   Colony Count     Final   Value: NO GROWTH     Performed at [facility]   Culture     Final   Value: NO GROWTH  Performed at [facility]   Report Status 06/10/2014 FINAL   Final    [x]  Patient discharged originally without antimicrobial agent and treatment is now indicated  33 yo who came in with vaginal discharge. Wet prep did show some trich so she was sent out on flagyl. They did a culture and it came back with group b strep. She is [redacted] wks pregnant. We'll treat her with a short course of amoxicillin.   New antibiotic prescription:   Amoxicillin 500mg  PO TID x7 days  ED Provider: Junius Finner, PA   Ulyses [place]ward, PharmD Pager: [phone] Infectious Diseases Pharmacist Phone# [phone]

## 2014-06-13 ENCOUNTER — Telehealth (HOSPITAL_BASED_OUTPATIENT_CLINIC_OR_DEPARTMENT_OTHER): Payer: Self-pay

## 2014-06-13 NOTE — Telephone Encounter (Signed)
Post ED Visit - Positive Culture Follow-up: Chart Hand-off to ED Flow Manager  Culture assessed and recommendations reviewed by: []  Wes Dulaney, Pharm.D., BCPS []  Celedonio MiyamotoJeremy Frens, [address] BCPS []  Georgina PillionElizabeth Martin, [address] BCPS [x]  Lake LoreleiMinh Pham, [address] BCPS, AAHIVP []  Estella HuskMichelle Turner, Pharm .D., BCPS, AAHIVP []    Positive Abscess culture, Group B Strep  [x]  Patient discharged without antimicrobial prescription and treatment is now indicated []  Organism is resistant to prescribed ED discharge antimicrobial []  Patient with positive blood cultures  Changes discussed with ED provider: E. RaymondO'Malley GeorgiaPA New antibiotic prescription "Amoxicillin 500 mg po TID x 7 days"    Martha RightClark, Martha Howe 06/13/2014, 2:32 AM

## 2014-09-21 ENCOUNTER — Encounter (HOSPITAL_COMMUNITY): Payer: Self-pay | Admitting: Emergency Medicine

## 2018-12-18 ENCOUNTER — Other Ambulatory Visit (HOSPITAL_COMMUNITY): Payer: Self-pay | Admitting: Family

## 2018-12-18 DIAGNOSIS — N63 Unspecified lump in unspecified breast: Secondary | ICD-10-CM

## 2019-01-02 ENCOUNTER — Ambulatory Visit (INDEPENDENT_AMBULATORY_CARE_PROVIDER_SITE_OTHER): Payer: Medicaid Other | Admitting: Otolaryngology

## 2019-01-02 DIAGNOSIS — H608X3 Other otitis externa, bilateral: Secondary | ICD-10-CM

## 2019-01-09 ENCOUNTER — Other Ambulatory Visit (HOSPITAL_COMMUNITY): Payer: Self-pay | Admitting: Family

## 2019-01-09 ENCOUNTER — Encounter (HOSPITAL_COMMUNITY): Payer: Self-pay

## 2019-01-09 ENCOUNTER — Ambulatory Visit (HOSPITAL_COMMUNITY)
Admission: RE | Admit: 2019-01-09 | Discharge: 2019-01-09 | Disposition: A | Discharge status: Home / Self Care | Payer: Medicaid Other | Source: Ambulatory Visit | Attending: Family | Admitting: Family

## 2019-01-09 ENCOUNTER — Ambulatory Visit (HOSPITAL_COMMUNITY): Payer: Medicaid Other

## 2019-01-09 DIAGNOSIS — N63 Unspecified lump in unspecified breast: Secondary | ICD-10-CM

## 2019-01-09 DIAGNOSIS — N6341 Unspecified lump in right breast, subareolar: Secondary | ICD-10-CM | POA: Insufficient documentation

## 2019-01-13 ENCOUNTER — Other Ambulatory Visit (HOSPITAL_COMMUNITY): Payer: Self-pay | Admitting: Family

## 2019-01-13 DIAGNOSIS — N63 Unspecified lump in unspecified breast: Secondary | ICD-10-CM

## 2019-01-14 ENCOUNTER — Encounter (HOSPITAL_COMMUNITY): Payer: Self-pay

## 2019-01-14 ENCOUNTER — Other Ambulatory Visit (HOSPITAL_COMMUNITY): Payer: Medicaid Other

## 2019-01-14 ENCOUNTER — Ambulatory Visit (HOSPITAL_COMMUNITY)
Admission: RE | Admit: 2019-01-14 | Discharge: 2019-01-14 | Disposition: A | Discharge status: Home / Self Care | Payer: Medicaid Other | Source: Ambulatory Visit | Attending: Family | Admitting: Family

## 2019-01-14 ENCOUNTER — Other Ambulatory Visit (HOSPITAL_COMMUNITY): Payer: Self-pay | Admitting: Family

## 2019-01-14 DIAGNOSIS — N63 Unspecified lump in unspecified breast: Secondary | ICD-10-CM

## 2019-01-14 DIAGNOSIS — R928 Other abnormal and inconclusive findings on diagnostic imaging of breast: Secondary | ICD-10-CM

## 2019-01-14 DIAGNOSIS — N6341 Unspecified lump in right breast, subareolar: Secondary | ICD-10-CM | POA: Diagnosis not present

## 2019-01-14 MED ORDER — LIDOCAINE-EPINEPHRINE (PF) 1 %-1:200000 IJ SOLN
INTRAMUSCULAR | Status: AC
  Filled 2019-01-14: qty 30

## 2019-01-14 MED ORDER — LIDOCAINE HCL (PF) 1 % IJ SOLN
INTRAMUSCULAR | Status: AC
  Administered 2019-01-14: 5 mL
  Filled 2019-01-14: qty 5

## 2019-01-27 ENCOUNTER — Ambulatory Visit (INDEPENDENT_AMBULATORY_CARE_PROVIDER_SITE_OTHER): Payer: Medicaid Other | Admitting: Otolaryngology

## 2019-02-04 ENCOUNTER — Other Ambulatory Visit (HOSPITAL_COMMUNITY): Payer: Self-pay | Admitting: Family

## 2019-02-04 ENCOUNTER — Other Ambulatory Visit: Payer: Self-pay

## 2019-02-04 ENCOUNTER — Ambulatory Visit (HOSPITAL_COMMUNITY)
Admission: RE | Admit: 2019-02-04 | Discharge: 2019-02-04 | Disposition: A | Discharge status: Home / Self Care | Payer: Medicaid Other | Source: Ambulatory Visit | Attending: Family | Admitting: Family

## 2019-02-04 DIAGNOSIS — N644 Mastodynia: Secondary | ICD-10-CM

## 2019-07-29 IMAGING — MG DIGITAL DIAGNOSTIC BILATERAL MAMMOGRAM WITH TOMO AND CAD
6 of 10 series · 6 of 30 positions shown · non-contrast
Comparison: None.

CLINICAL DATA: 37-year-old female presenting for evaluation of a
palpable lump which has been fluctuating over the past 5 months. She
states that this recently recurred, but has not gone away this time.

EXAM:
DIGITAL DIAGNOSTIC BILATERAL MAMMOGRAM WITH CAD AND TOMO
ULTRASOUND RIGHT BREAST

[R MLO synth-2D (1 of 2)]
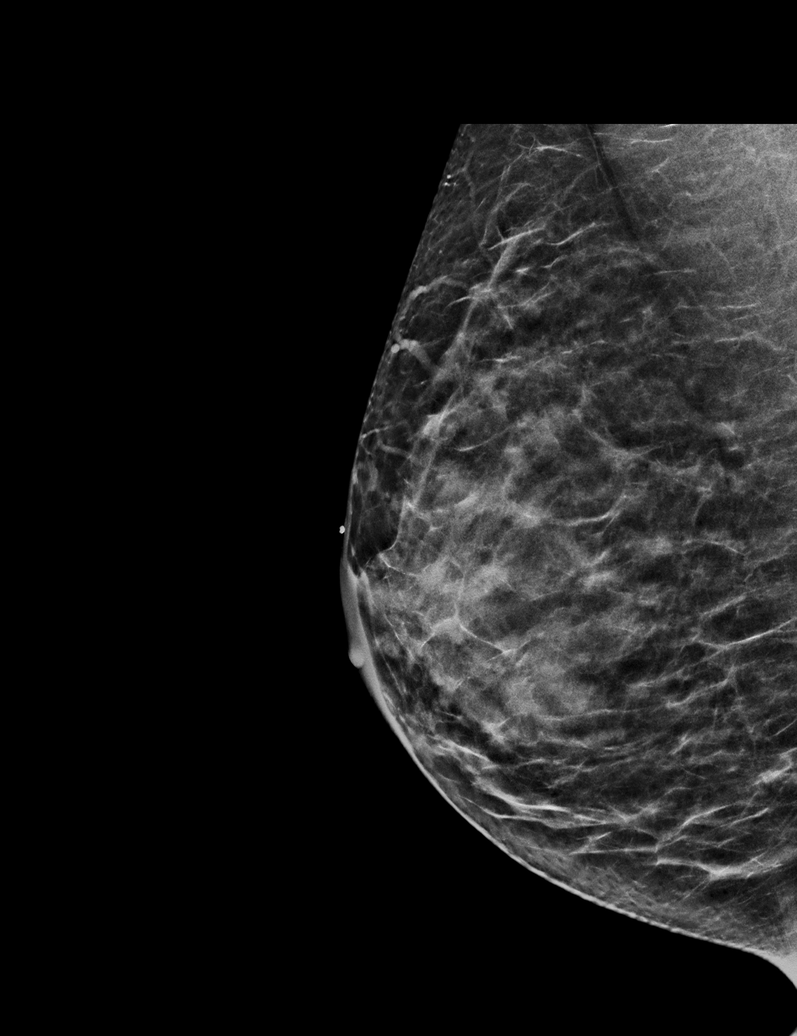

[L CC synth-2D]
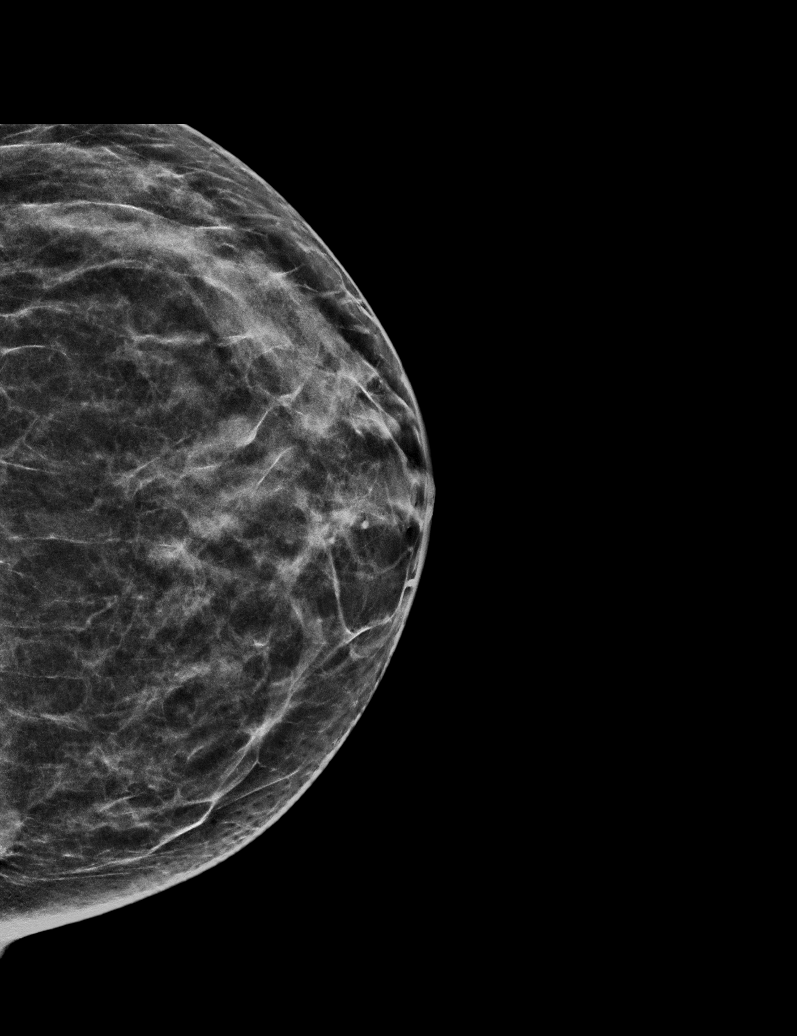

[R CC synth-2D]
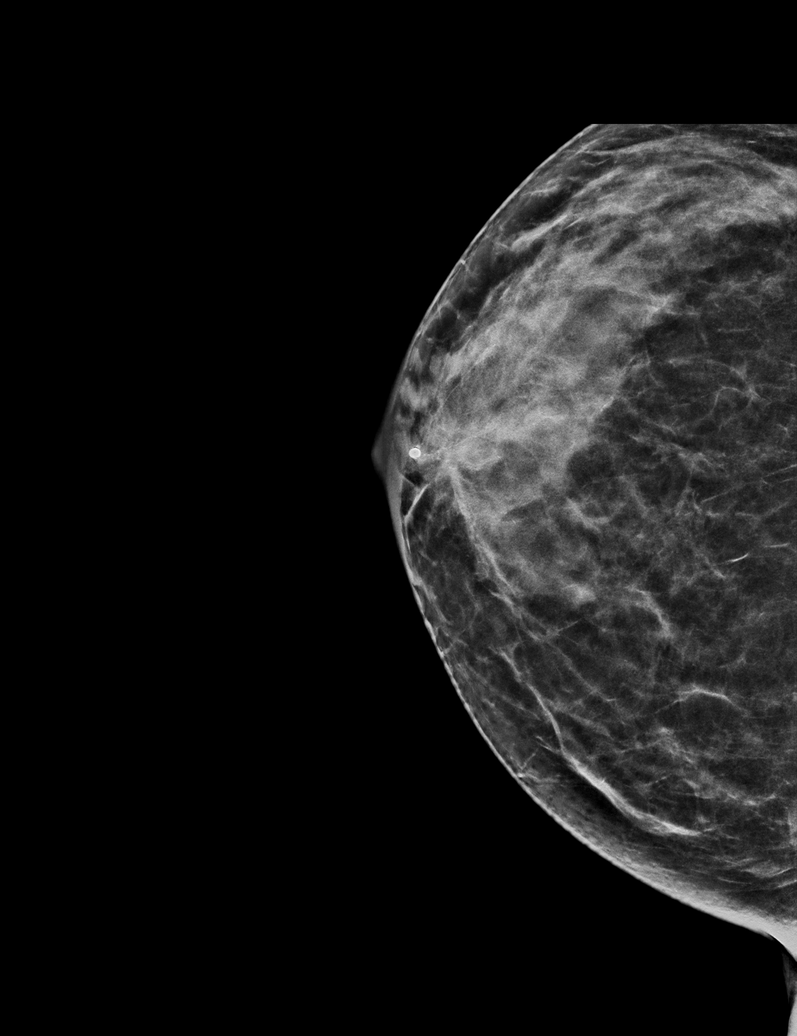

[R MLO synth-2D (2 of 2)]
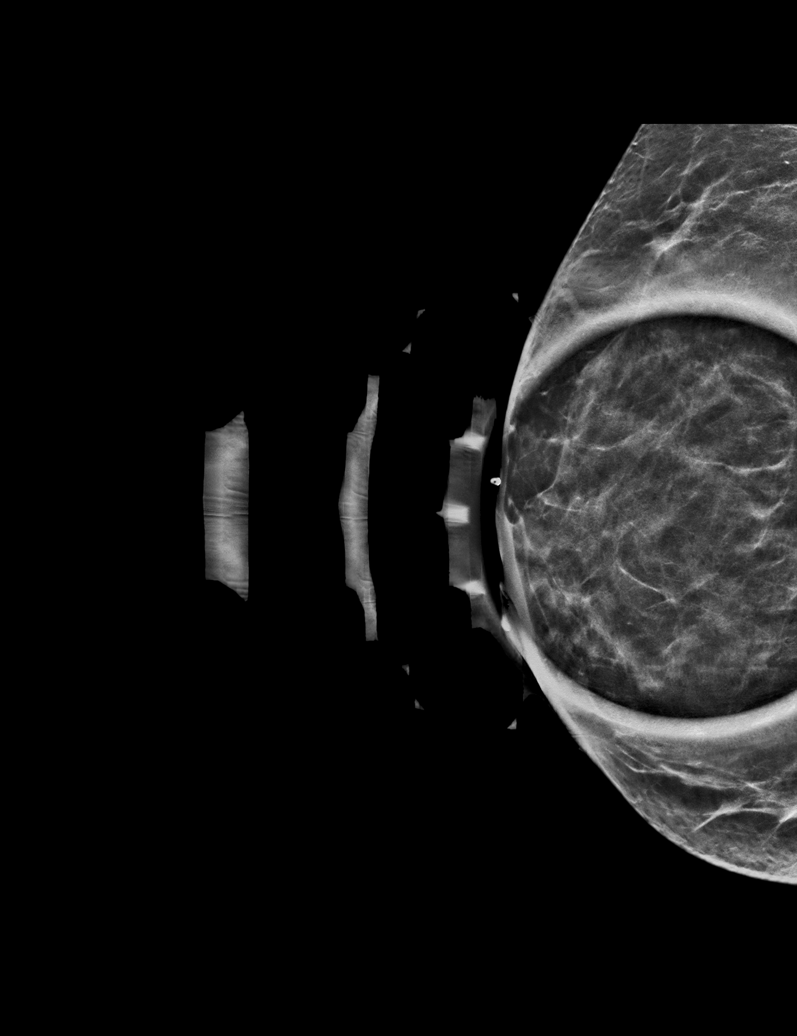

[L MLO synth-2D]
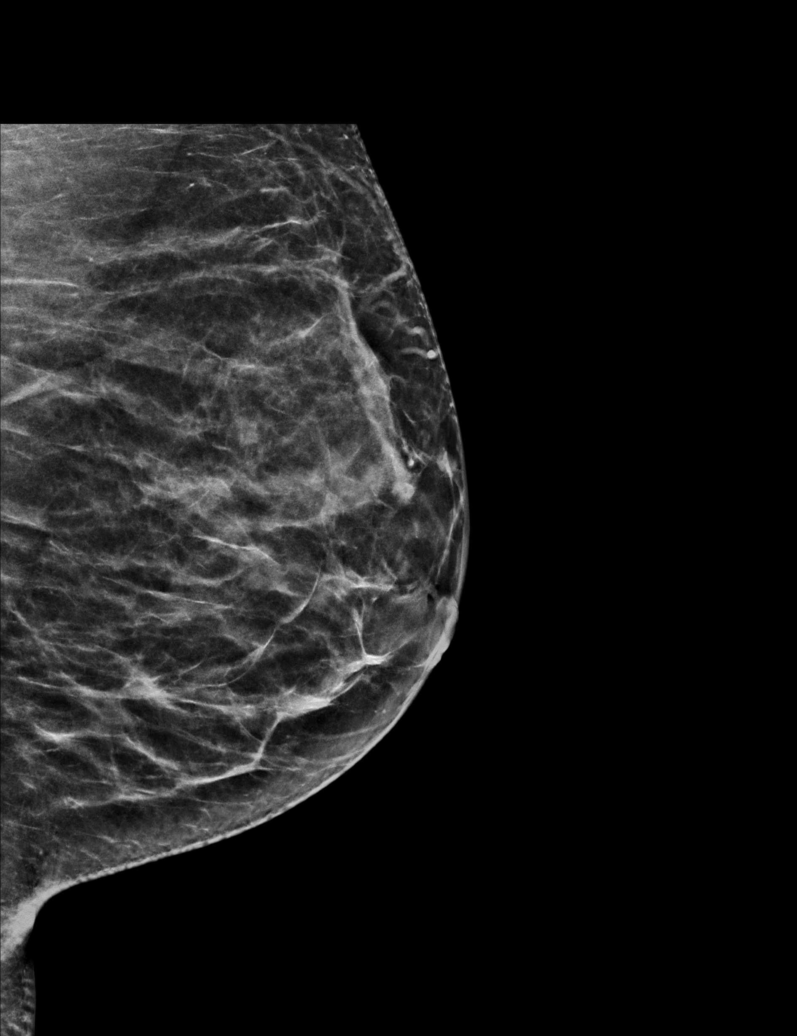

[L CC tomo · tomo slice 27/54.0]
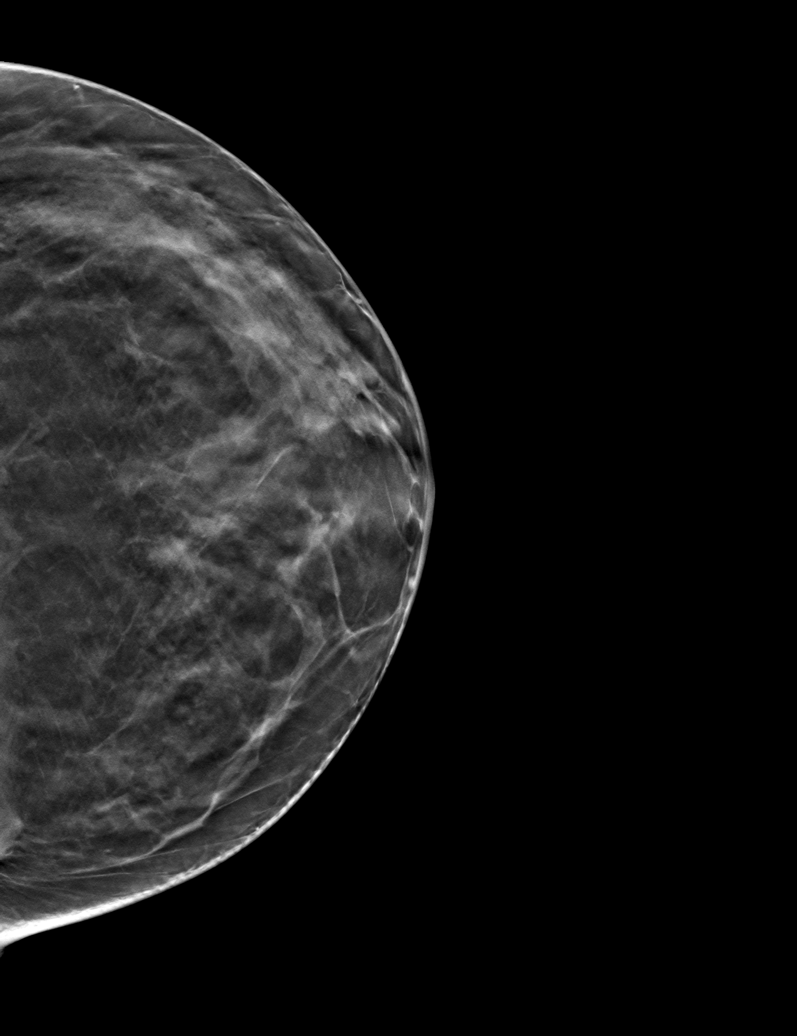

[6 of 30 positions shown; findings below may reference images not displayed]

ACR Breast Density Category c: The breast tissue is heterogeneously
dense, which may obscure small masses.
FINDINGS: A BB has been placed on the superior aspect of the periareolar right
breast indicating the palpable site of concern. There is a possible
obscured mass deep to the palpable marker. There is thickening of
the skin at the palpable site is well. No other suspicious
calcifications, masses or areas of distortion are seen in the
bilateral breasts.

Mammographic images were processed with CAD.

On physical exam, there is a firm easily palpable superficial lump
in the superior retroareolar right breast.

Targeted ultrasound is performed, showing a hypoechoic oval mass in
the retroareolar right breast at 12 o'clock measuring 1.2 x 1.1 x
1.1 cm. No blood flow is seen within the mass on color Doppler
imaging.
IMPRESSION: 1. There is an indeterminate mass in the retroareolar right breast
at the palpable site of concern, which is favored to represent a
complicated cyst.

2.  No mammographic evidence of left breast malignancy.

RECOMMENDATION:
Ultrasound-guided aspiration is recommended for the probable
complicated cyst in the retroareolar right breast. The patient is
aware that if this does not completely aspirate, we will proceed
with ultrasound guided biopsy. The procedure has been scheduled for
01/14/2019 at 1 p.m..

I have discussed the findings and recommendations with the patient.
Results were also provided in writing at the conclusion of the
visit. If applicable, a reminder letter will be sent to the patient
regarding the next appointment.

BI-RADS CATEGORY  4: Suspicious.

## 2019-08-24 IMAGING — US ULTRASOUND RIGHT BREAST LIMITED
1 series · 12 of 12 positions shown · non-contrast
Comparison: Previous exam(s).

CLINICAL DATA: 37-year-old female status post ultrasound-guided
biopsy on 01/14/2019 which demonstrated benign breast tissue with
mixed inflammation. The findings may represent a ruptured cyst or
resolving abscess. The patient reports worsening pain and redness
since the biopsy which has become severe today. She was evaluated by
her primary care physician and a course of antibiotics was begun.

EXAM:
ULTRASOUND OF THE RIGHT BREAST

[Series 1: ultrasound right breast limited · 12 of 12 slices shown]
[im 1/12]
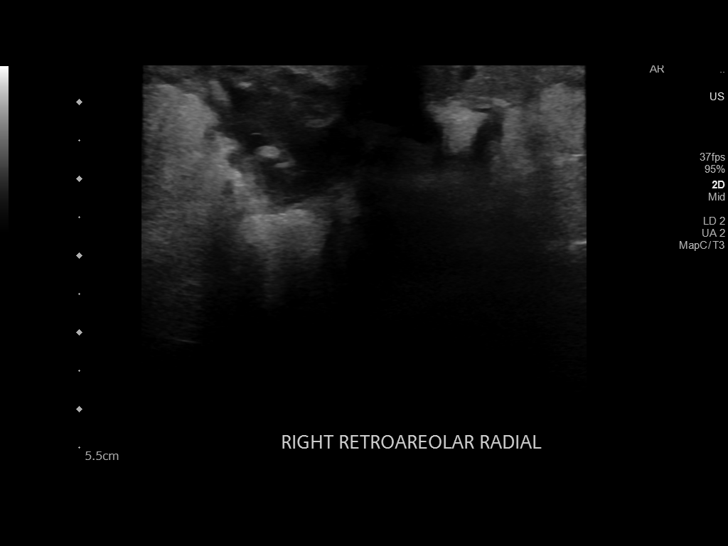
[im 2/12]
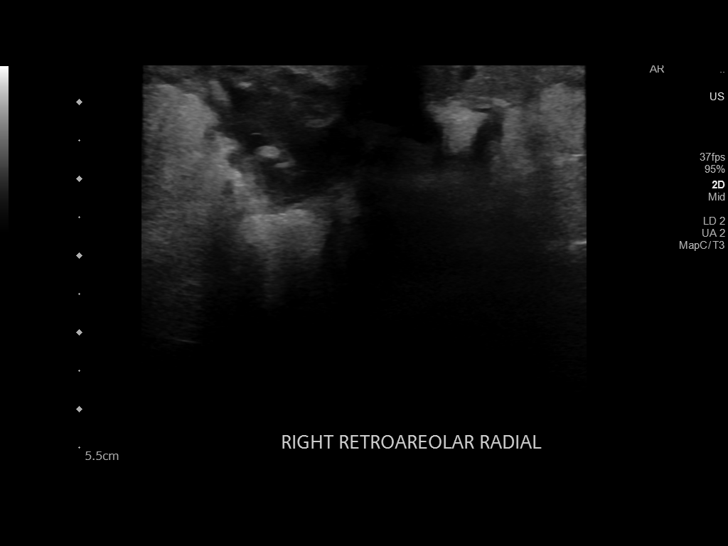
[im 3/12]
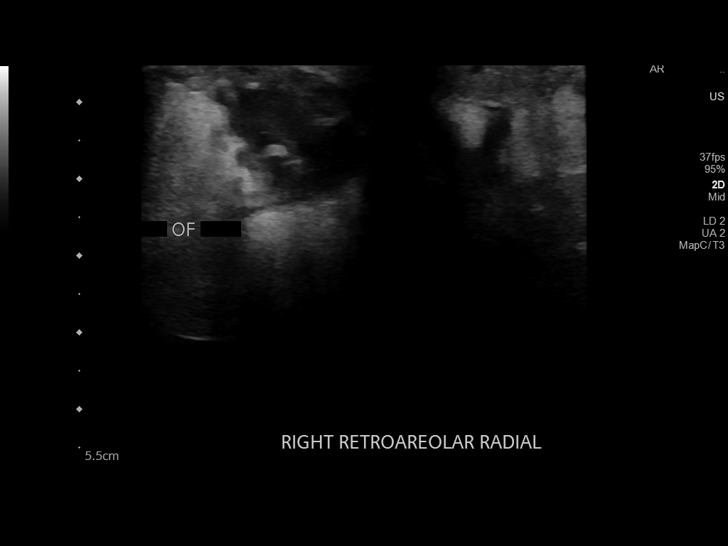
[im 4/12]
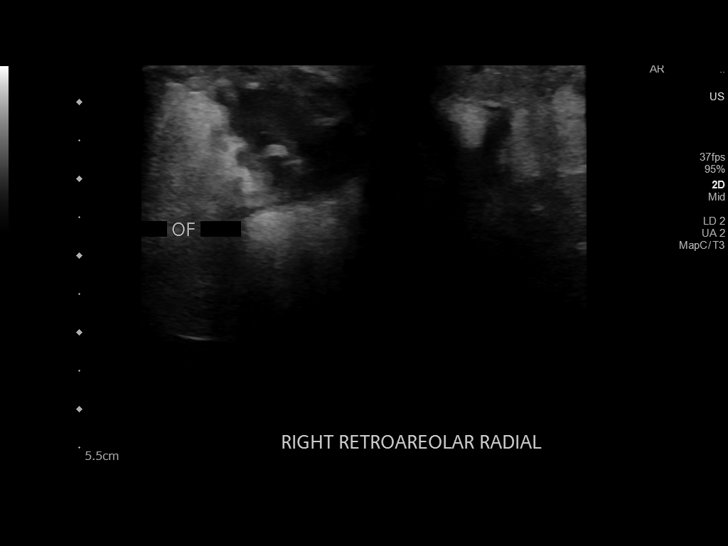
[im 5/12]
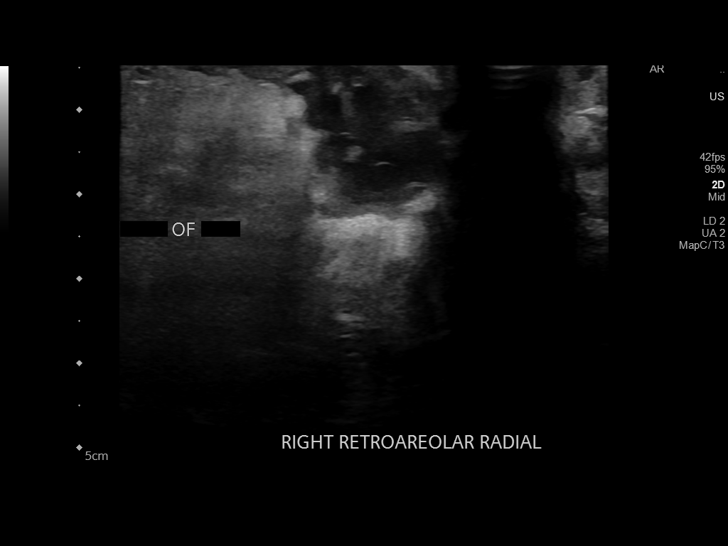
[im 6/12]
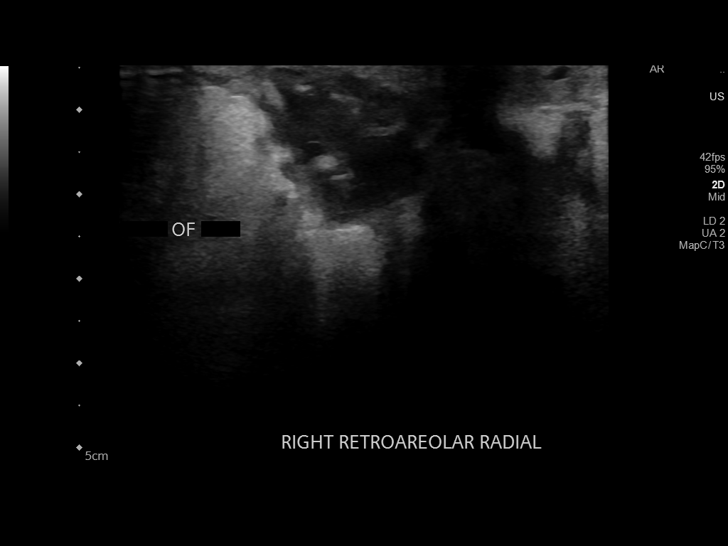
[im 7/12]
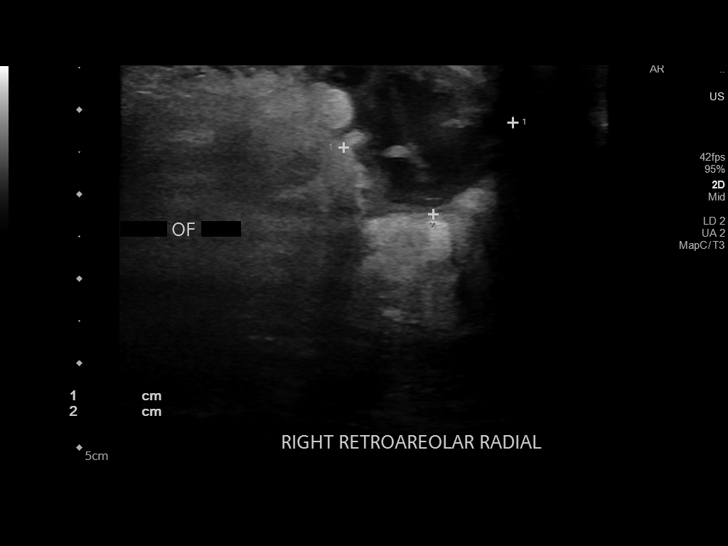
[im 8/12]
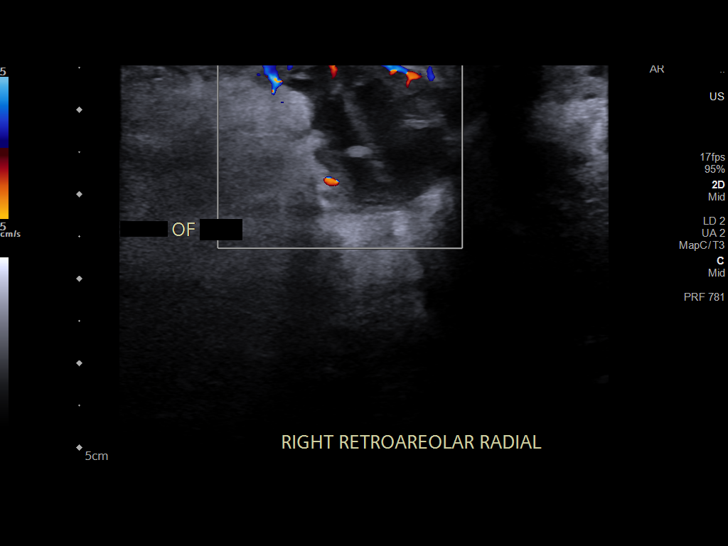
[im 9/12]
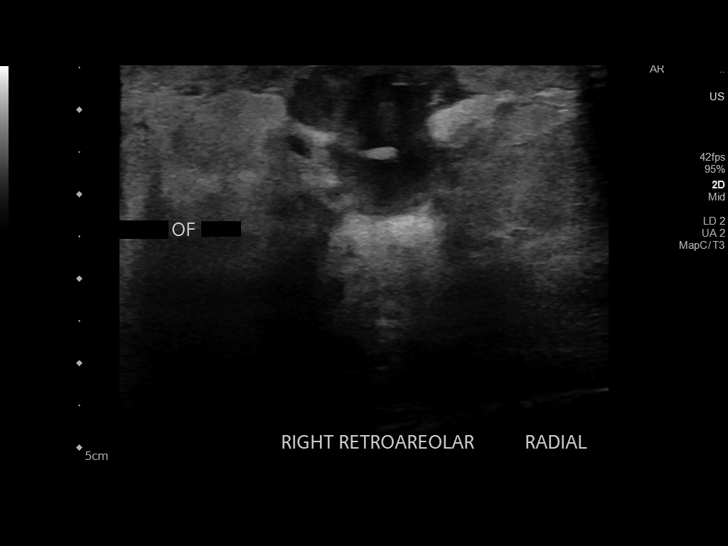
[im 10/12]
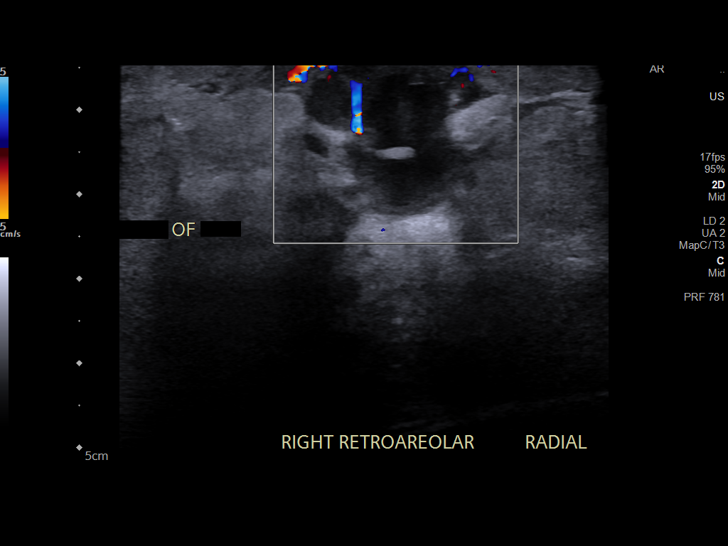
[im 11/12]
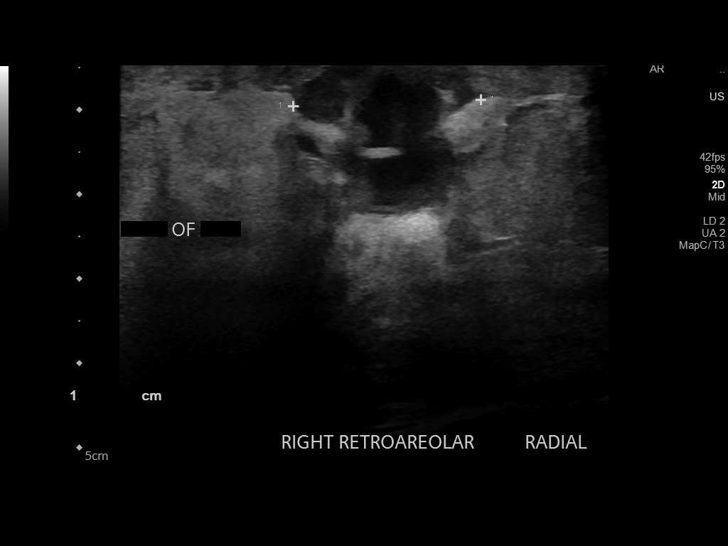
[im 12/12]
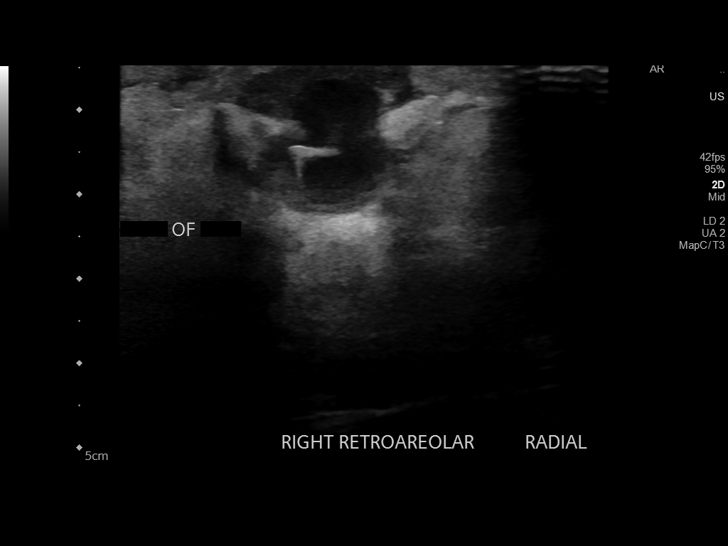

[12 of 12 positions shown; findings below may reference images not displayed]

FINDINGS: On physical exam, the right breast appears red and erythematous
involving the periareolar aspect. The patient is in significant pain
to palpation.

Targeted ultrasound is performed, showing an irregular heterogeneous
mass with peripheral vascularity just below the skin in the right
retroareolar position. It measures 2.2 x 2.0 x 2.0 cm. There is
associated edema and skin thickening.
IMPRESSION: Small phlegmonous fluid collection/abscess at the site of the
patient's clinical symptoms. Ultrasound-guided aspiration was
offered today. However, preauthorization could not be obtained from
medicated.

RECOMMENDATION:
Recommendation is for the patient to begin her course of
antibiotics. A follow-up appointment will be made for her in 1 week.
If her symptoms persist or worsen after beginning her antibiotics,
she was instructed to contact the [REDACTED] for
immediate re-evaluation and possible aspiration.

I have discussed the findings and recommendations with the patient.
Results were also provided in writing at the conclusion of the
visit. If applicable, a reminder letter will be sent to the patient
regarding the next appointment.

BI-RADS CATEGORY  3: Probably benign.

## 2019-09-24 IMAGING — US US BREAST BX W LOC DEV 1ST LESION IMG BX SPEC US GUIDE*R*
1 series · 9 of 9 positions shown · non-contrast
Comparison: Previous exam(s).

Addendum:
CLINICAL DATA: 37-year-old female with an indeterminate palpable
mass in the right breast at 12 o'clock subareolar.

EXAM:
ULTRASOUND GUIDED RIGHT BREAST CORE NEEDLE BIOPSY

[Series 1: us breast bx w loc dev 1st lesion img bx spec us g · 0.06mm/px · 9 of 9 slices shown]
[im 1/9]
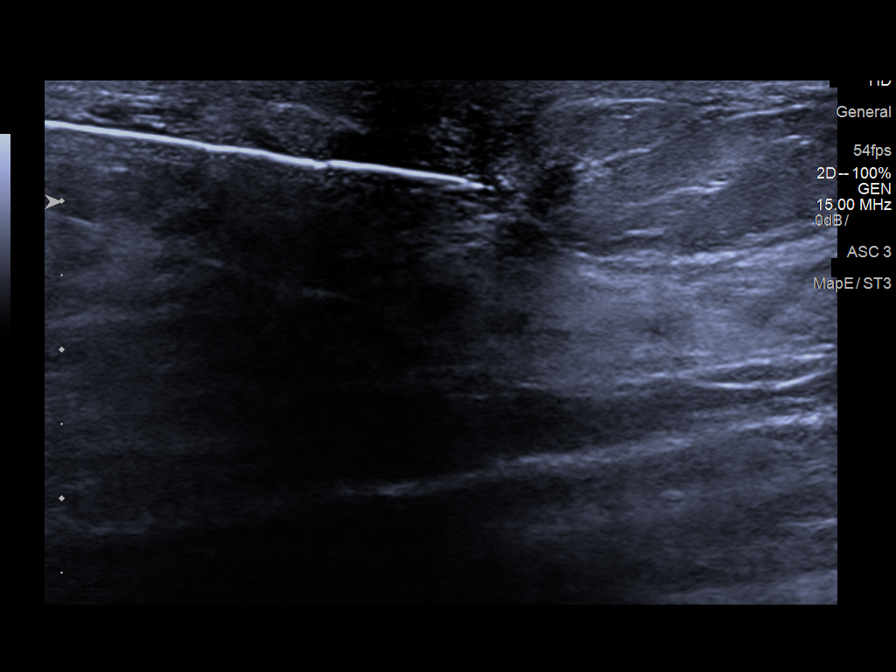
[im 2/9]
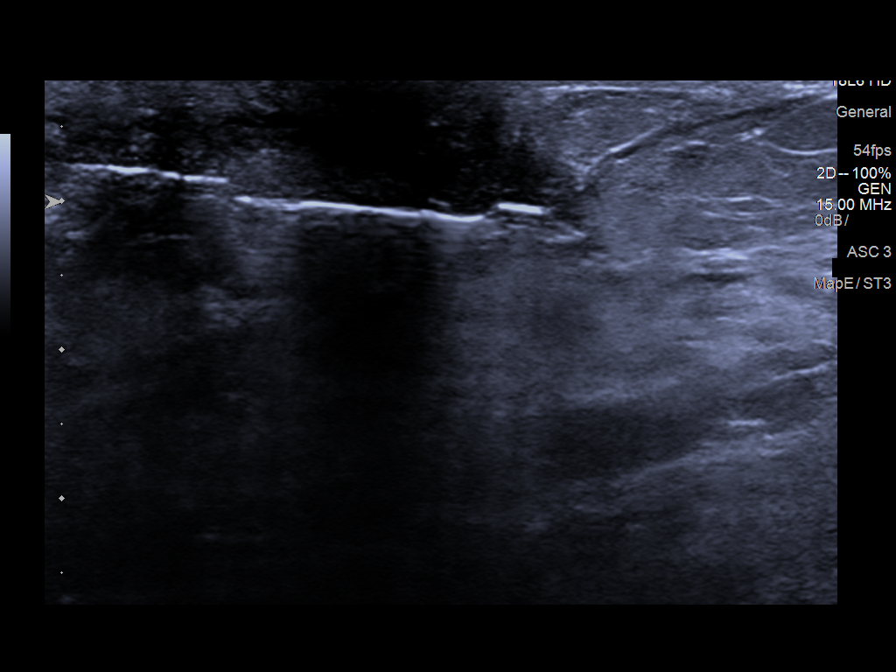
[im 3/9]
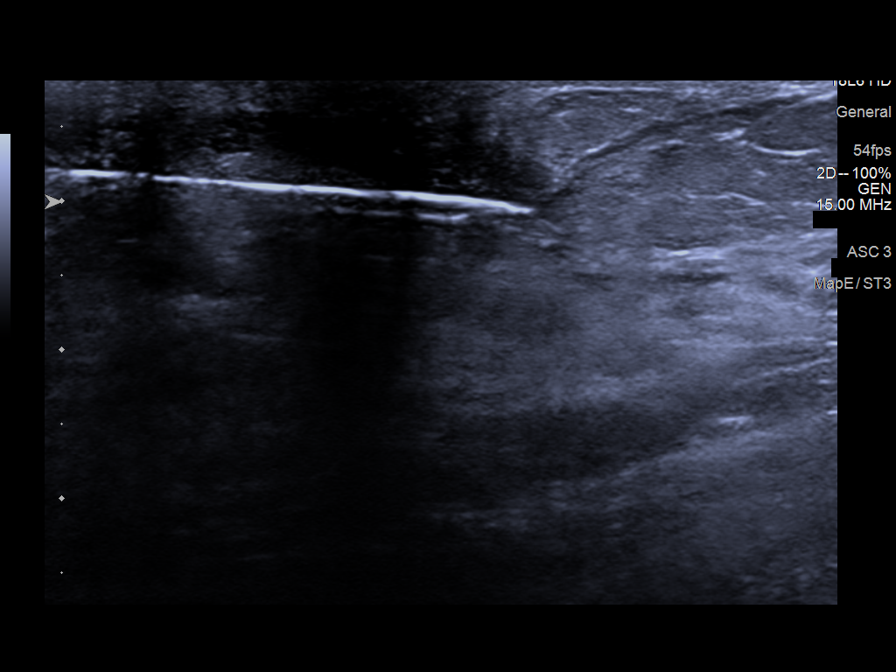
[im 4/9]
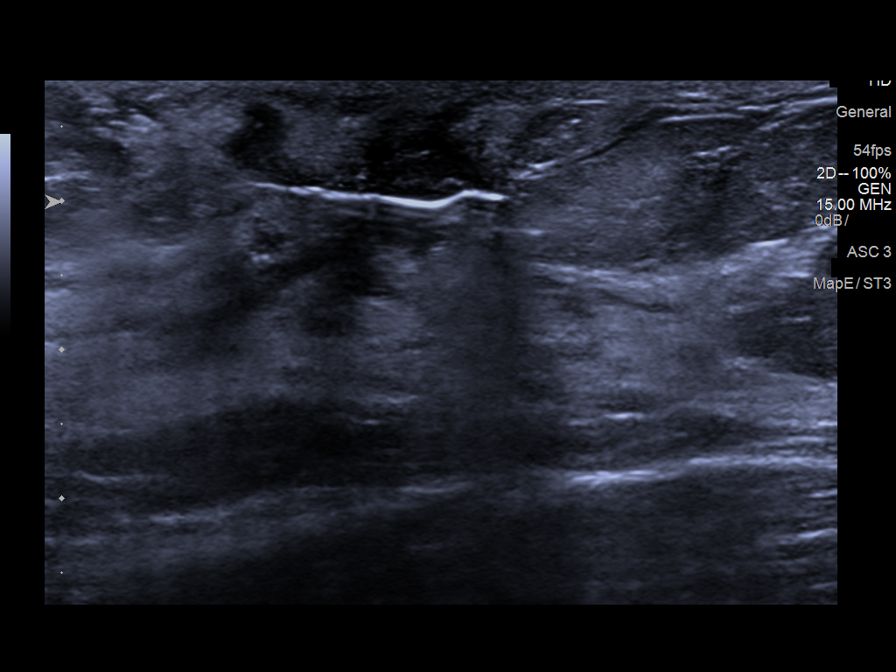
[im 5/9]
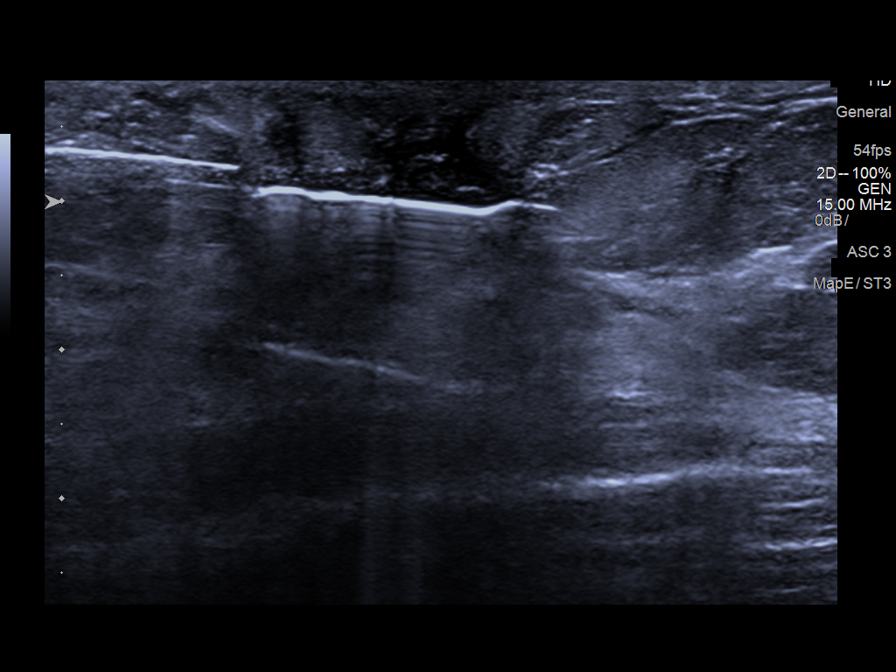
[im 6/9]
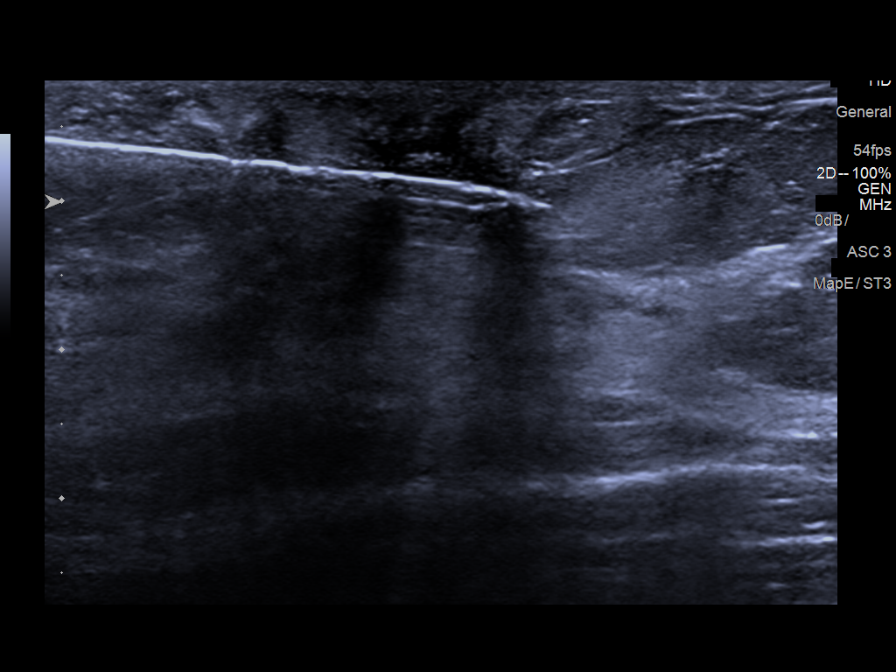
[im 7/9]
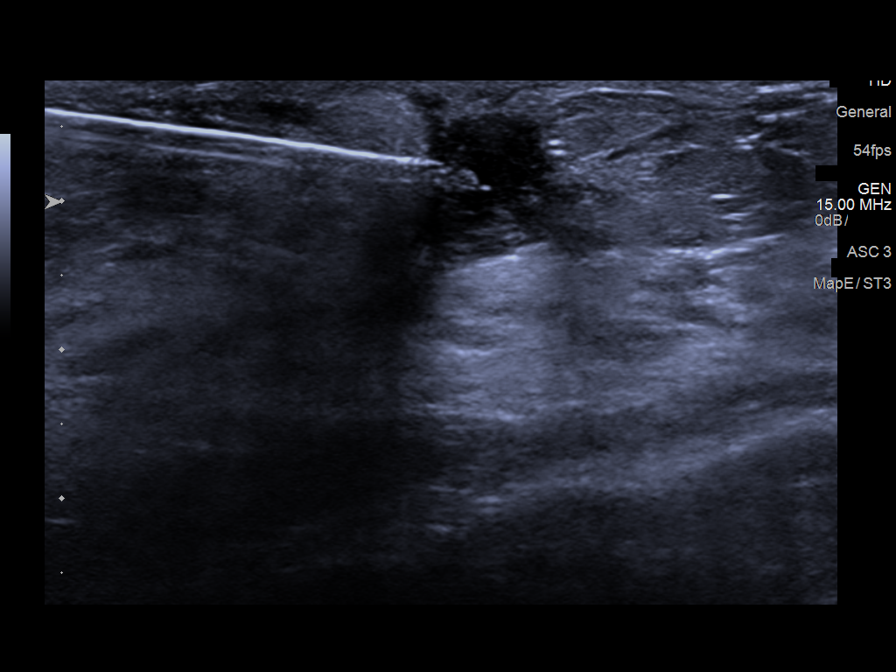
[im 8/9]
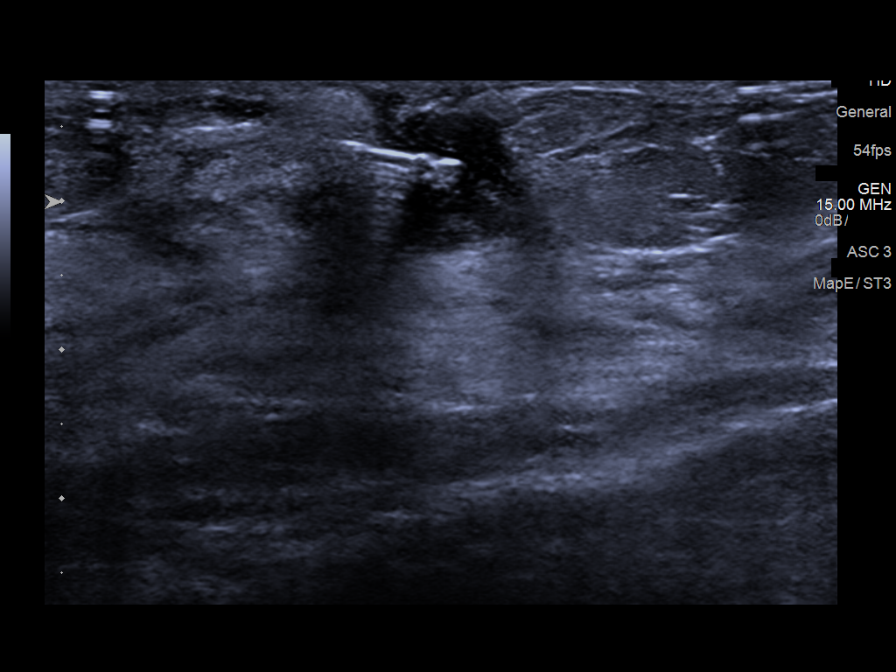
[im 9/9]
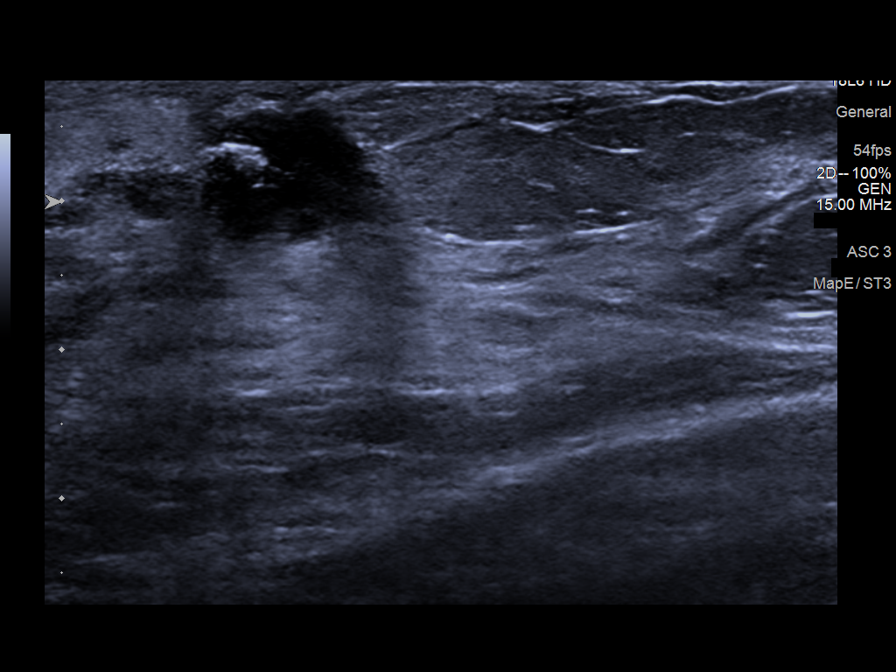

[9 of 9 positions shown; findings below may reference images not displayed]



Lesion quadrant: Upper-outer

Using sterile technique and 1% Lidocaine as local anesthetic, under
direct ultrasound visualization, a 14 gauge Wilmary device was
used to perform biopsy of the mass in the right breast at 12 o'clock
subareolar using a lateral to medial approach. At the conclusion of
the procedure a ribbon shaped tissue marker clip was deployed into
the biopsy cavity. Follow up 2 view mammogram was performed and
dictated separately.
IMPRESSION: Ultrasound guided biopsy of the mass in the right breast at 12
o'clock subareolar. No apparent complications.

ADDENDUM:
Pathology of the RIGHT breast biopsy revealed Breast, right, needle
core biopsy, right 12 o'clock subareolar BENIGN BREAST TISSUE WITH
MIXED INFLAMMATION, SEE COMMENT. Microscopic Comment: The findings
may represent a ruptured cyst or resolving abscess.

This was found to be concordant by Dr. Volt Bizzy.

Recommendation: Screening mammogram at age 41.

At the patient's request, results and recommendations were relayed
to the patient by phone by Courtois, Marie Rose ([REDACTED])
on 01/15/2019 at [DATE]. The patient stated she had a small amount
of bleeding following the biopsy and some soreness today but
otherwise doing well. Post biopsy instructions were reviewed with
the patient and all of her questions were answered. She was
encouraged to contact Courtois, Marie Rose or the imaging department of

Addendum by Courtois, Marie Rose on 01/15/2019.

*** End of Addendum ***

## 2021-05-10 ENCOUNTER — Other Ambulatory Visit: Payer: Self-pay

## 2021-05-10 ENCOUNTER — Encounter (HOSPITAL_COMMUNITY): Payer: Self-pay | Admitting: *Deleted

## 2021-05-10 ENCOUNTER — Ambulatory Visit
Admission: EM | Admit: 2021-05-10 | Discharge: 2021-05-10 | Disposition: A | Discharge status: Home / Self Care | Payer: Medicaid Other | Attending: Family Medicine | Admitting: Family Medicine

## 2021-05-10 ENCOUNTER — Encounter: Payer: Self-pay | Admitting: Emergency Medicine

## 2021-05-10 ENCOUNTER — Emergency Department (HOSPITAL_COMMUNITY)
Admission: EM | Admit: 2021-05-10 | Discharge: 2021-05-10 | Discharge status: Against Medical Advice | Payer: Medicaid Other | Attending: Emergency Medicine | Admitting: Emergency Medicine

## 2021-05-10 DIAGNOSIS — R000 Tachycardia, unspecified: Secondary | ICD-10-CM | POA: Diagnosis not present

## 2021-05-10 DIAGNOSIS — N898 Other specified noninflammatory disorders of vagina: Secondary | ICD-10-CM | POA: Insufficient documentation

## 2021-05-10 DIAGNOSIS — F17210 Nicotine dependence, cigarettes, uncomplicated: Secondary | ICD-10-CM | POA: Diagnosis not present

## 2021-05-10 DIAGNOSIS — N760 Acute vaginitis: Secondary | ICD-10-CM | POA: Diagnosis not present

## 2021-05-10 MED ORDER — METRONIDAZOLE 500 MG PO TABS: 500.0000 mg | ORAL_TABLET | Freq: Two times a day (BID) | ORAL | 0 refills | Status: DC

## 2021-05-10 MED ORDER — FLUCONAZOLE 150 MG PO TABS: 150.0000 mg | ORAL_TABLET | Freq: Once | ORAL | 0 refills | Status: AC

## 2021-05-10 NOTE — ED Triage Notes (Signed)
Pt with vaginal discharge white yellow color and smells foul per pt. For past week.  Burns with urination.

## 2021-05-10 NOTE — ED Provider Notes (Signed)
[facility] EMERGENCY DEPARTMENT Provider Note   CSN: [id number] Arrival date & time: 05/10/21  1448     History Chief Complaint  Patient presents with   Vaginal Discharge    Martha Howe is a 40 y.o. female presenting for evaluation of a several week history of vaginal discharge of white to yellow watery discharge and a foul odor.  She reports having a history of trichomonas in the past and this reminds her of that infection.  She has had unprotected sex about 3 weeks ago and her symptoms began after that time.  Her partner denies any symptoms.  She denies fevers or chills has no nausea or vomiting, no abdominal pain.  She does have pain when she urinates but denies urinary frequency, back pain or hematuria.  She has had no treatments for her symptoms prior to arrival.  The history is provided by the patient.      History reviewed. No pertinent past medical history.  There are no problems to display for this patient.   History reviewed. No pertinent surgical history.   OB History     Gravida  1   Para      Term      Preterm      AB      Living         SAB      IAB      Ectopic      Multiple      Live Births              History reviewed. No pertinent family history.  Social History   Tobacco Use   Smoking status: Every Day    Pack years: 0.00    Types: Cigarettes   Smokeless tobacco: Never  Substance Use Topics   Alcohol use: Yes    Comment: occ   Drug use: No    Home Medications Prior to Admission medications   Medication Sig Start Date End Date Taking? Authorizing Provider  metroNIDAZOLE (FLAGYL) 500 MG tablet 1 po tid with food 06/08/14   Ivery Quale, PA-C  Prenatal Vit-Fe Fumarate-FA (PRENATAL MULTIVITAMIN) TABS tablet Take 1 tablet by mouth daily at 12 noon.    [provider]    Allergies    Patient has no known allergies.  Review of Systems   Review of Systems  Constitutional:  Negative for chills and fever.   HENT: Negative.    Eyes: Negative.   Respiratory:  Negative for chest tightness and shortness of breath.   Cardiovascular:  Negative for chest pain.  Gastrointestinal:  Negative for abdominal pain, nausea and vomiting.  Genitourinary:  Positive for dysuria, vaginal discharge and vaginal pain. Negative for frequency and urgency.  Musculoskeletal:  Negative for arthralgias, joint swelling and neck pain.  Skin: Negative.  Negative for rash and wound.  Neurological:  Negative for dizziness, weakness, light-headedness, numbness and headaches.  Psychiatric/Behavioral: Negative.     Physical Exam Updated Vital Signs BP 113/80 (BP Location: Right Arm)   Pulse (!) 109   Temp 98.1 F (36.7 C) (Oral)   Resp 14   Ht 5\' 7"  (1.702 m)   Wt 81.2 kg   LMP 04/21/2021   SpO2 98%   BMI 28.04 kg/m   Physical Exam Vitals and nursing note reviewed.  Constitutional:      Appearance: She is well-developed.  HENT:     Head: Normocephalic and atraumatic.  Eyes:     Conjunctiva/sclera: Conjunctivae normal.  Cardiovascular:  Rate and Rhythm: Regular rhythm. Tachycardia present.     Heart sounds: Normal heart sounds.  Pulmonary:     Effort: Pulmonary effort is normal.     Breath sounds: Normal breath sounds. No wheezing.  Abdominal:     General: There is no distension.     Palpations: Abdomen is soft.     Tenderness: There is no abdominal tenderness. There is no guarding.  Musculoskeletal:        General: Normal range of motion.     Cervical back: Normal range of motion.  Skin:    General: Skin is warm and dry.  Neurological:     Mental Status: She is alert.  Psychiatric:        Mood and Affect: Mood is anxious.    ED Results / Procedures / Treatments   Labs (all labs ordered are listed, but only abnormal results are displayed) Labs Reviewed  WET PREP, GENITAL  URINALYSIS, ROUTINE W REFLEX MICROSCOPIC  POC URINE PREG, ED  GC/CHLAMYDIA PROBE AMP ([place]) NOT AT [facility]     EKG None  Radiology No results found.  Procedures Procedures   Medications Ordered in ED Medications - No data to display  ED Course  I have reviewed the triage vital signs and the nursing notes.  Pertinent labs & imaging results that were available during my care of the patient were reviewed by me and considered in my medical decision making (see chart for details).    MDM Rules/Calculators/A&P                          Labs have been ordered.  We discussed HIV and syphilis screening as well, patient deferred stating that her PCP screened her for these STDs.  Prior to being able to complete her pelvic exam eloped.  She stated we were taking too long.  I was notified by her RN that she had left but I was not able to talk to this patient before she left. Final Clinical Impression(s) / ED Diagnoses Final diagnoses:  Vaginal discharge    Rx / DC Orders ED Discharge Orders     None        Victoriano Lain 05/10/21 1732    Blane Ohara, MD 05/11/21 0009

## 2021-05-10 NOTE — ED Triage Notes (Signed)
Pt here for vaginal discharge with odor; pt left AMA for ED today for same

## 2021-05-11 LAB — CERVICOVAGINAL ANCILLARY ONLY
Bacterial Vaginitis (gardnerella): POSITIVE — AB
Candida Glabrata: NEGATIVE
Candida Vaginitis: NEGATIVE
Chlamydia: NEGATIVE
Comment: NEGATIVE
Comment: NEGATIVE
Comment: NEGATIVE
Comment: NEGATIVE
Comment: NEGATIVE
Comment: NORMAL
Neisseria Gonorrhea: NEGATIVE
Trichomonas: POSITIVE — AB

## 2023-02-04 ENCOUNTER — Encounter: Payer: Self-pay | Admitting: Emergency Medicine

## 2023-02-04 ENCOUNTER — Ambulatory Visit (INDEPENDENT_AMBULATORY_CARE_PROVIDER_SITE_OTHER): Payer: Medicaid Other

## 2023-02-04 ENCOUNTER — Ambulatory Visit
Admission: EM | Admit: 2023-02-04 | Discharge: 2023-02-04 | Disposition: A | Discharge status: Home / Self Care | Payer: Medicaid Other | Attending: Nurse Practitioner | Admitting: Nurse Practitioner

## 2023-02-04 DIAGNOSIS — M25561 Pain in right knee: Secondary | ICD-10-CM

## 2023-02-04 MED ORDER — IBUPROFEN 800 MG PO TABS: 800.0000 mg | ORAL_TABLET | Freq: Three times a day (TID) | ORAL | 0 refills | Status: DC

## 2023-02-04 NOTE — ED Provider Notes (Signed)
[facility]    CSN: [id number] Arrival date & time: 02/04/23  [id number]      History   Chief Complaint No chief complaint on file.   HPI Martha Howe is a 42 y.o. female.   The history is provided by the patient.   The patient presents for complaints of right knee pain that been present for the past week.  Patient states symptoms worsen, and she notices pain when she walks.  Patient denies any known injury or trauma.  She also states that she feels like the knee is, "give out on her".  She states that the knee has also been swelling.  He states that she works at [employer] and does a lot of walking and squatting.  She denies numbness, tingling, or radiation of pain.  She states she has been taking over-the-counter analgesics for pain or discomfort.  History reviewed. No pertinent past medical history.  There are no problems to display for this patient.   History reviewed. No pertinent surgical history.  OB History     Gravida  1   Para      Term      Preterm      AB      Living         SAB      IAB      Ectopic      Multiple      Live Births               Home Medications    Prior to Admission medications   Medication Sig Start Date End Date Taking? Authorizing Provider  ibuprofen (ADVIL) 800 MG tablet Take 1 tablet (800 mg total) by mouth 3 (three) times daily. 02/04/23  Yes Perley Arthurs-Warren, Alda Lea, NP  metroNIDAZOLE (FLAGYL) 500 MG tablet Take 1 tablet (500 mg total) by mouth 2 (two) times daily. 05/10/21   Scot Jun, NP  Prenatal Vit-Fe Fumarate-FA (PRENATAL MULTIVITAMIN) TABS tablet Take 1 tablet by mouth daily at 12 noon.    [provider]    Family History History reviewed. No pertinent family history.  Social History Social History   Tobacco Use   Smoking status: Every Day    Types: Cigarettes   Smokeless tobacco: Never  Vaping Use   Vaping Use: Never used  Substance Use Topics   Alcohol use: Yes     Comment: occ   Drug use: No     Allergies   Patient has no known allergies.   Review of Systems Review of Systems Per HPI  Physical Exam Triage Vital Signs ED Triage Vitals  Enc Vitals Group     BP 02/04/23 0954 129/88     Pulse Rate 02/04/23 0954 88     Resp 02/04/23 0954 18     Temp 02/04/23 0954 97.9 F (36.6 C)     Temp Source 02/04/23 0954 Oral     SpO2 02/04/23 0954 98 %     Weight --      Height --      Head Circumference --      Peak Flow --      Pain Score 02/04/23 0955 2     Pain Loc --      Pain Edu? --      Excl. in [place]? --    No data found.  Updated Vital Signs BP 129/88 (BP Location: Right Arm)   Pulse 88   Temp 97.9 F (36.6 C) (Oral)  Resp 18   LMP 02/04/2023 (Exact Date)   SpO2 98%   Visual Acuity Right Eye Distance:   Left Eye Distance:   Bilateral Distance:    Right Eye Near:   Left Eye Near:    Bilateral Near:     Physical Exam Vitals and nursing note reviewed.  Constitutional:      General: She is not in acute distress.    Appearance: Normal appearance.  HENT:     Head: Normocephalic.  Eyes:     Extraocular Movements: Extraocular movements intact.     Pupils: Pupils are equal, round, and reactive to light.  Pulmonary:     Effort: Pulmonary effort is normal.  Musculoskeletal:     Cervical back: Normal range of motion.     Right knee: Swelling present. No deformity, effusion or erythema. Normal range of motion. No tenderness. Normal pulse.  Skin:    General: Skin is warm and dry.  Neurological:     General: No focal deficit present.     Mental Status: She is alert and oriented to person, place, and time.  Psychiatric:        Mood and Affect: Mood normal.        Behavior: Behavior normal.      UC Treatments / Results  Labs (all labs ordered are listed, but only abnormal results are displayed) Labs Reviewed - No data to display  EKG   Radiology DG Knee Complete 4 Views Right  Result Date: 02/04/2023 CLINICAL  DATA:  Pain for 1 week with swelling for 2 days, atraumatic. EXAM: RIGHT KNEE - COMPLETE 4+ VIEW COMPARISON:  None Available. FINDINGS: No evidence of fracture, dislocation, or joint effusion. No evidence of arthropathy or other focal bone abnormality. Soft tissues are unremarkable. IMPRESSION: Negative. Electronically Signed   By: Jorje Guild M.D.   On: 02/04/2023 10:09    Procedures Procedures (including critical care time)  Medications Ordered in UC Medications - No data to display  Initial Impression / Assessment and Plan / UC Course  I have reviewed the triage vital signs and the nursing notes.  Pertinent labs & imaging results that were available during my care of the patient were reviewed by me and considered in my medical decision making (see chart for details).  The patient is well-appearing, she is in no acute distress, vital signs are stable.  X-rays of the right knee are negative.  Difficult to ascertain the cause of the patient's knee pain as she has not had any obvious known injury or trauma.  Will provide patient with a hinged knee brace to provide compression and support.  Patient was also prescribed ibuprofen 800 mg to take for knee pain or discomfort.  Supportive care recommendations were provided to the patient to include RICE therapy.  Patient was also provided gentle stretching and range of motion exercises.  Patient advised if symptoms fail to improve within the next 1 to 2 weeks, recommend that she follow-up with orthopedics or sooner if symptoms worsen.  Patient was given information for [facility] [place] and for [facility].  Patient is in agreement with this plan of care and verbalizes understanding.  All questions were answered.  Patient stable for discharge.  Work note was provided.   Final Clinical Impressions(s) / UC Diagnoses   Final diagnoses:  Right knee pain, unspecified chronicity     Discharge Instructions      Take medication as  prescribed. May apply ice to help with swelling.  Apply for 20 minutes, remove for 1 hour, then repeat is much as possible. RICE therapy, rest, ice, compression, and elevation. As discussed, recommend wearing shoes with good insoles and support while working. Gentle stretching and range of motion exercises until symptoms improve. As discussed, if symptoms do not improve within the next 1 to 2 weeks, recommend following up with orthopedics.  You can follow-up with [facility] [place] at [phone] or with [facility] at [phone]. Follow-up as needed.     ED Prescriptions     Medication Sig Dispense Auth. Provider   ibuprofen (ADVIL) 800 MG tablet Take 1 tablet (800 mg total) by mouth 3 (three) times daily. 21 tablet Romeo Zielinski-Warren, Alda Lea, NP      PDMP not reviewed this encounter.   Tish Men, NP 02/04/23 [phone]

## 2023-02-04 NOTE — ED Triage Notes (Signed)
Right knee pain x 1 week.  Swelling over the past 2 days.  Hurts to bend and walk. No known injury

## 2023-02-04 NOTE — Discharge Instructions (Signed)
Take medication as prescribed. May apply ice to help with swelling.  Apply for 20 minutes, remove for 1 hour, then repeat is much as possible. RICE therapy, rest, ice, compression, and elevation. As discussed, recommend wearing shoes with good insoles and support while working. Gentle stretching and range of motion exercises until symptoms improve. As discussed, if symptoms do not improve within the next 1 to 2 weeks, recommend following up with orthopedics.  You can follow-up with [facility] [place] at [phone] or with [facility] at [phone]. Follow-up as needed.

## 2023-02-24 ENCOUNTER — Emergency Department (HOSPITAL_COMMUNITY): Payer: Medicaid Other

## 2023-02-24 ENCOUNTER — Emergency Department (HOSPITAL_COMMUNITY)
Admission: EM | Admit: 2023-02-24 | Discharge: 2023-02-25 | Disposition: A | Discharge status: Home / Self Care | Payer: Medicaid Other | Attending: Emergency Medicine | Admitting: Emergency Medicine

## 2023-02-24 ENCOUNTER — Encounter (HOSPITAL_COMMUNITY): Payer: Self-pay | Admitting: Emergency Medicine

## 2023-02-24 ENCOUNTER — Other Ambulatory Visit: Payer: Self-pay

## 2023-02-24 DIAGNOSIS — R079 Chest pain, unspecified: Secondary | ICD-10-CM | POA: Insufficient documentation

## 2023-02-24 DIAGNOSIS — N760 Acute vaginitis: Secondary | ICD-10-CM | POA: Diagnosis not present

## 2023-02-24 DIAGNOSIS — E876 Hypokalemia: Secondary | ICD-10-CM | POA: Diagnosis not present

## 2023-02-24 DIAGNOSIS — B9689 Other specified bacterial agents as the cause of diseases classified elsewhere: Secondary | ICD-10-CM | POA: Diagnosis not present

## 2023-02-24 DIAGNOSIS — N898 Other specified noninflammatory disorders of vagina: Secondary | ICD-10-CM | POA: Diagnosis present

## 2023-02-24 LAB — WET PREP, GENITAL
Clue Cells Wet Prep HPF POC: NONE SEEN
Sperm: NONE SEEN
WBC, Wet Prep HPF POC: >=10 — AB (ref <10)
Yeast Wet Prep HPF POC: NONE SEEN

## 2023-02-24 LAB — URINALYSIS, ROUTINE W REFLEX MICROSCOPIC
Bilirubin Urine: NEGATIVE
Glucose, UA: NEGATIVE mg/dL
Ketones, ur: NEGATIVE mg/dL
Nitrite: NEGATIVE
Protein, ur: 30 mg/dL — AB
Specific Gravity, Urine: 1.027 (ref 1.005–1.030)
WBC, UA: >50 WBC/hpf (ref 0–5)
pH: 6 (ref 5.0–8.0)

## 2023-02-24 LAB — CBC WITH DIFFERENTIAL/PLATELET
Abs Immature Granulocytes: 0.03 10*3/uL (ref 0.00–0.07)
Basophils Absolute: 0.1 10*3/uL (ref 0.0–0.1)
Basophils Relative: 1 %
Eosinophils Absolute: 0.3 10*3/uL (ref 0.0–0.5)
Eosinophils Relative: 2 %
HCT: 39.5 % (ref 36.0–46.0)
Hemoglobin: 13.4 g/dL (ref 12.0–15.0)
Immature Granulocytes: 0 %
Lymphocytes Relative: 38 %
Lymphs Abs: 5 10*3/uL — ABNORMAL HIGH (ref 0.7–4.0)
MCH: 29.5 pg (ref 26.0–34.0)
MCHC: 33.9 g/dL (ref 30.0–36.0)
MCV: 87 fL (ref 80.0–100.0)
Monocytes Absolute: 1.1 10*3/uL — ABNORMAL HIGH (ref 0.1–1.0)
Monocytes Relative: 8 %
Neutro Abs: 6.7 10*3/uL (ref 1.7–7.7)
Neutrophils Relative %: 51 %
Platelets: 243 10*3/uL (ref 150–400)
RBC: 4.54 MIL/uL (ref 3.87–5.11)
RDW: 14.4 % (ref 11.5–15.5)
WBC: 13.2 10*3/uL — ABNORMAL HIGH (ref 4.0–10.5)
nRBC: 0 % (ref 0.0–0.2)

## 2023-02-24 LAB — BASIC METABOLIC PANEL
Anion gap: 9 (ref 5–15)
BUN: 11 mg/dL (ref 6–20)
CO2: 22 mmol/L (ref 22–32)
Calcium: 8.2 mg/dL — ABNORMAL LOW (ref 8.9–10.3)
Chloride: 108 mmol/L (ref 98–111)
Creatinine, Ser: 0.79 mg/dL (ref 0.44–1.00)
GFR, Estimated: >60 mL/min (ref >60)
Glucose, Bld: 104 mg/dL — ABNORMAL HIGH (ref 70–99)
Potassium: 3 mmol/L — ABNORMAL LOW (ref 3.5–5.1)
Sodium: 139 mmol/L (ref 135–145)

## 2023-02-24 LAB — TROPONIN I (HIGH SENSITIVITY): Troponin I (High Sensitivity): <2 ng/L (ref <18)

## 2023-02-24 LAB — PREGNANCY, URINE: Preg Test, Ur: NEGATIVE

## 2023-02-24 MED ORDER — CEFTRIAXONE SODIUM 500 MG IJ SOLR: 500.0000 mg | Freq: Once | INTRAMUSCULAR | Status: DC

## 2023-02-24 MED ORDER — POTASSIUM CHLORIDE CRYS ER 20 MEQ PO TBCR: 20.0000 meq | EXTENDED_RELEASE_TABLET | Freq: Two times a day (BID) | ORAL | 0 refills | Status: DC

## 2023-02-24 MED ORDER — DOXYCYCLINE HYCLATE 100 MG PO CAPS: 100.0000 mg | ORAL_CAPSULE | Freq: Two times a day (BID) | ORAL | 0 refills | Status: DC

## 2023-02-24 MED ORDER — POTASSIUM CHLORIDE CRYS ER 20 MEQ PO TBCR
40.0000 meq | EXTENDED_RELEASE_TABLET | Freq: Once | ORAL | Status: AC
  Administered 2023-02-24: 40 meq via ORAL
  Filled 2023-02-24: qty 2

## 2023-02-24 MED ORDER — DEXTROSE 5 % IV SOLN: 500.0000 mg | Freq: Once | INTRAVENOUS | Status: DC

## 2023-02-24 NOTE — ED Triage Notes (Signed)
Vaginal itching, discharge with odor x 1 week. Tried OTC medication with no relief. Chest pain described as a heart flutter started today and pt was afraid to go to sleep. No SOB.

## 2023-02-24 NOTE — Discharge Instructions (Addendum)
Your potassium level tonight was low.  This may be the cause of your palpitations.  Please take the potassium as directed until finished.  Arrange follow-up appointment with your primary care provider in 1 week to have your potassium level rechecked.  You have also been given medication

## 2023-02-24 NOTE — ED Provider Notes (Signed)
[place] EMERGENCY DEPARTMENT AT [facility] Provider Note   CSN: [id number] Arrival date & time: 02/24/23  2142     History {Add pertinent medical, surgical, social history, OB history to HPI:1} Chief Complaint  Patient presents with  . Vaginal Itching  . Chest Pain    Martha Howe is a 42 y.o. female.   Vaginal Itching Associated symptoms include chest pain.  Chest Pain       Martha Howe is a 42 y.o. female who presents to the Emergency Department complaining of intermittent palpitations and pain over left upper chest area.  Symptoms began today.  She describes feeling a "flutter" sensation of her heart and feels like her heart may be beating irregularly.  States this is never happened before.  Denies excessive caffeine use or cocaine use.  Does admit to marijuana use.  No new medications.  She denies any shortness of breath, recent illness, cough or arm or jaw pain.  No prior cardiac history. She is also here requesting evaluation for malodorous vaginal discharge and itching for 1 week.  Concerned that she has an STD.  States that this does happen with her current sexual partner before.  She tried over-the-counter 3-day Monistat without relief.  She denies any abdominal pain, pelvic pain, abnormal vaginal bleeding or dysuria.   Home Medications Prior to Admission medications   Medication Sig Start Date End Date Taking? Authorizing Provider  ibuprofen (ADVIL) 800 MG tablet Take 1 tablet (800 mg total) by mouth 3 (three) times daily. 02/04/23   Leath-Warren, Sadie Haber, NP  metroNIDAZOLE (FLAGYL) 500 MG tablet Take 1 tablet (500 mg total) by mouth 2 (two) times daily. 05/10/21   Bing Neighbors, NP  Prenatal Vit-Fe Fumarate-FA (PRENATAL MULTIVITAMIN) TABS tablet Take 1 tablet by mouth daily at 12 noon.    [provider]      Allergies    Patient has no known allergies.    Review of Systems   Review of Systems  Cardiovascular:  Positive for chest  pain.    Physical Exam Updated Vital Signs BP (!) 140/96   Pulse 91   Temp 98.3 F (36.8 C) (Oral)   Resp 15   Wt 81 kg   LMP 02/04/2023 (Exact Date)   SpO2 100%   BMI 27.97 kg/m  Physical Exam  ED Results / Procedures / Treatments   Labs (all labs ordered are listed, but only abnormal results are displayed) Labs Reviewed - No data to display  EKG EKG Interpretation  Date/Time:  Saturday February 24 2023 22:00:46 EDT Ventricular Rate:  90 PR Interval:  170 QRS Duration: 93 QT Interval:  371 QTC Calculation: 454 R Axis:   10 Text Interpretation: Sinus rhythm No previous tracing Confirmed by Cathren Laine ([id number]) on 02/24/2023 10:04:50 PM  Radiology No results found.  Procedures Procedures  {Document cardiac monitor, telemetry assessment procedure when appropriate:1}  Medications Ordered in ED Medications - No data to display  ED Course/ Medical Decision Making/ A&P   {   Click here for ABCD2, HEART and other calculatorsREFRESH Note before signing :1}                          Medical Decision Making  ***  {Document critical care time when appropriate:1} {Document review of labs and clinical decision tools ie heart score, Chads2Vasc2 etc:1}  {Document your independent review of radiology images, and any outside records:1} {Document your discussion with  family members, caretakers, and with consultants:1} {Document social determinants of health affecting pt's care:1} {Document your decision making why or why not admission, treatments were needed:1} Final Clinical Impression(s) / ED Diagnoses Final diagnoses:  None    Rx / DC Orders ED Discharge Orders     None

## 2023-02-24 NOTE — ED Provider Notes (Incomplete)
[place] EMERGENCY DEPARTMENT AT [facility] Provider Note   CSN: [id number] Arrival date & time: 02/24/23  2142     History {Add pertinent medical, surgical, social history, OB history to HPI:1} Chief Complaint  Patient presents with  . Vaginal Itching  . Chest Pain    Martha Howe is a 42 y.o. female.   Vaginal Itching Associated symptoms include chest pain. Pertinent negatives include no headaches and no shortness of breath.  Chest Pain Associated symptoms: palpitations   Associated symptoms: no back pain, no cough, no fever, no headache, no nausea, no shortness of breath, no vomiting and no weakness         Martha Howe is a 42 y.o. female who presents to the Emergency Department complaining of intermittent palpitations and pain over left upper chest area.  Symptoms began today.  She describes feeling a "flutter" sensation of her heart and feels like her heart may be beating irregularly.  States this is never happened before.  Denies excessive caffeine use or cocaine use.  Does admit to marijuana use.  No new medications.  She denies any shortness of breath, recent illness, cough or arm or jaw pain.  No prior cardiac history. She is also here requesting evaluation for malodorous vaginal discharge and itching for 1 week.  Concerned that she has an STD.  States that this does happen with her current sexual partner before.  She tried over-the-counter 3-day Monistat without relief.  She denies any abdominal pain, pelvic pain, abnormal vaginal bleeding or dysuria.   Home Medications Prior to Admission medications   Medication Sig Start Date End Date Taking? Authorizing Provider  ibuprofen (ADVIL) 800 MG tablet Take 1 tablet (800 mg total) by mouth 3 (three) times daily. 02/04/23   Leath-Warren, Sadie Haber, NP  metroNIDAZOLE (FLAGYL) 500 MG tablet Take 1 tablet (500 mg total) by mouth 2 (two) times daily. 05/10/21   Bing Neighbors, NP  Prenatal Vit-Fe Fumarate-FA  (PRENATAL MULTIVITAMIN) TABS tablet Take 1 tablet by mouth daily at 12 noon.    [provider]      Allergies    Patient has no known allergies.    Review of Systems   Review of Systems  Constitutional:  Negative for chills and fever.  Respiratory:  Negative for cough and shortness of breath.   Cardiovascular:  Positive for chest pain and palpitations. Negative for leg swelling.  Gastrointestinal:  Negative for diarrhea, nausea and vomiting.  Genitourinary:  Positive for vaginal discharge. Negative for decreased urine volume, difficulty urinating, dysuria, pelvic pain, vaginal bleeding and vaginal pain.  Musculoskeletal:  Negative for back pain.  Neurological:  Negative for syncope, weakness and headaches.    Physical Exam Updated Vital Signs BP (!) 140/96   Pulse 91   Temp 98.3 F (36.8 C) (Oral)   Resp 15   Wt 81 kg   LMP 02/04/2023 (Exact Date)   SpO2 100%   BMI 27.97 kg/m  Physical Exam Vitals and nursing note reviewed. Exam conducted with a chaperone present.  Constitutional:      General: She is not in acute distress.    Appearance: She is well-developed. She is not ill-appearing or toxic-appearing.  Cardiovascular:     Rate and Rhythm: Normal rate and regular rhythm.     Pulses: Normal pulses.  Pulmonary:     Effort: Pulmonary effort is normal. No respiratory distress.  Abdominal:     Palpations: Abdomen is soft.  Tenderness: There is no abdominal tenderness. There is no right CVA tenderness or left CVA tenderness.  Genitourinary:    Vagina: Vaginal discharge present. No bleeding.     Cervix: No cervical motion tenderness.     Uterus: Not enlarged and not tender.      Adnexa:        Right: No mass or tenderness.         Left: No mass or tenderness.       Comments: Bimanual exam performed by me, no adnexal masses or tenderness.  No cervical motion tenderness.  There is significant white creamy vaginal discharge noted. Musculoskeletal:         General: Normal range of motion.  Skin:    General: Skin is warm.     Capillary Refill: Capillary refill takes less than 2 seconds.  Neurological:     General: No focal deficit present.     Mental Status: She is alert.     Sensory: No sensory deficit.     Motor: No weakness.     ED Results / Procedures / Treatments   Labs (all labs ordered are listed, but only abnormal results are displayed) Labs Reviewed  CBC WITH DIFFERENTIAL/PLATELET - Abnormal; Notable for the following components:      Result Value   WBC 13.2 (*)    Lymphs Abs 5.0 (*)    Monocytes Absolute 1.1 (*)    All other components within normal limits  BASIC METABOLIC PANEL - Abnormal; Notable for the following components:   Potassium 3.0 (*)    Glucose, Bld 104 (*)    Calcium 8.2 (*)    All other components within normal limits  URINALYSIS, ROUTINE W REFLEX MICROSCOPIC - Abnormal; Notable for the following components:   APPearance CLOUDY (*)    Hgb urine dipstick MODERATE (*)    Protein, ur 30 (*)    Leukocytes,Ua LARGE (*)    Bacteria, UA RARE (*)    All other components within normal limits  WET PREP, GENITAL  PREGNANCY, URINE  GC/CHLAMYDIA PROBE AMP ([place]) NOT AT [facility]  TROPONIN I (HIGH SENSITIVITY)    EKG EKG Interpretation  Date/Time:  Saturday February 24 2023 22:00:46 EDT Ventricular Rate:  90 PR Interval:  170 QRS Duration: 93 QT Interval:  371 QTC Calculation: 454 R Axis:   10 Text Interpretation: Sinus rhythm No previous tracing Confirmed by Cathren LaineSteinl, Kevin ([id number]) on 02/24/2023 10:04:50 PM  Radiology DG Chest Portable 1 View  Result Date: 02/24/2023 CLINICAL DATA:  Chest pain EXAM: PORTABLE CHEST 1 VIEW COMPARISON:  None Available. FINDINGS: The heart size and mediastinal contours are within normal limits. Both lungs are clear. The visualized skeletal structures are unremarkable. IMPRESSION: No active disease. Electronically Signed   By: Darliss CheneyAmy  Guttmann M.D.   On: 02/24/2023 22:34     Procedures Procedures  {Document cardiac monitor, telemetry assessment procedure when appropriate:1}  Medications Ordered in ED Medications - No data to display  ED Course/ Medical Decision Making/ A&P   {   Click here for ABCD2, HEART and other calculatorsREFRESH Note before signing :1}                          Medical Decision Making Patient here with reported intermittent feelings of chest palpitations.  Began earlier today.  Has persisted.  No prior history of cardiac palpitations.  Denies any excessive caffeine or cocaine use. Also request evaluation for vaginal itching and excessive  malodorous vaginal discharge.  Concerned about STD.  Differential would include but not limited to arrhythmia, ACS, electrolyte abnormality, ACS also possible but felt less likely given that her chest symptoms are intermittent.  Vaginal itching and discharge would include possible STI, candidiasis, BV.  No pelvic pain or abnormal vaginal bleeding to suggest emergent process.  Will check pregnancy test.  Amount and/or Complexity of Data Reviewed Labs: ordered.    Details: Labs interpreted by me, white count of 13,000.  Hemoglobin reassuring.  Chemistries show hypokalemia with potassium of 3.0.  Her potassium was 3.1 and 2022.  No recent values available for comparison.  Kidney functions reassuring troponin less than 2 urinalysis shows cloudy urine with moderate blood large leukocytes, 21-50 red cells and greater than 50 white cells with rare bacteria urine pregnancy test is negative. Radiology: ordered.    Details: Chest x-ray without acute cardiopulmonary process ECG/medicine tests: ordered.    Details: EKG shows sinus rhythm Discussion of management or test interpretation with external provider(s): Patient here with reported palpitations possibly secondary to hypokalemia.  She has been given oral potassium here.  Vaginal discharge possibly related to STI.  Urinalysis shows possible infection, culture  pending.  Feel she is appropriate for discharge home, will provide prescription for potassium treat prophylactically for STI.  Follow-up with her PCP next week to have potassium rechecked.  Risk Prescription drug management.    {Document critical care time when appropriate:1} {Document review of labs and clinical decision tools ie heart score, Chads2Vasc2 etc:1}  {Document your independent review of radiology images, and any outside records:1} {Document your discussion with family members, caretakers, and with consultants:1} {Document social determinants of health affecting pt's care:1} {Document your decision making why or why not admission, treatments were needed:1} Final Clinical Impression(s) / ED Diagnoses Final diagnoses:  None    Rx / DC Orders ED Discharge Orders     None

## 2023-02-25 MED ORDER — CEFTRIAXONE SODIUM 500 MG IJ SOLR
500.0000 mg | INTRAMUSCULAR | Status: DC
  Administered 2023-02-25: 500 mg via INTRAMUSCULAR
  Filled 2023-02-25: qty 500

## 2023-02-25 MED ORDER — STERILE WATER FOR INJECTION IJ SOLN
INTRAMUSCULAR | Status: AC
  Filled 2023-02-25: qty 10

## 2023-02-25 MED ORDER — METRONIDAZOLE 500 MG PO TABS: 2000.0000 mg | ORAL_TABLET | Freq: Once | ORAL | 0 refills | Status: AC

## 2023-02-26 LAB — URINE CULTURE: Culture: 2000 — AB

## 2023-02-26 LAB — GC/CHLAMYDIA PROBE AMP (~~LOC~~) NOT AT ARMC
Chlamydia: NEGATIVE
Comment: NEGATIVE
Comment: NORMAL
Neisseria Gonorrhea: NEGATIVE

## 2023-02-27 ENCOUNTER — Telehealth (HOSPITAL_BASED_OUTPATIENT_CLINIC_OR_DEPARTMENT_OTHER): Payer: Self-pay

## 2023-02-27 NOTE — Telephone Encounter (Signed)
Post ED Visit - Positive Culture Follow-up  Culture report reviewed by antimicrobial stewardship pharmacist: [facility] [x]  Eldridge Scot, Pharm.D. []  Celedonio Miyamoto, Pharm.D., BCPS AQ-ID []  Garvin Fila, Pharm.D., BCPS []  Georgina Pillion, [address].D., BCPS []  [place], [address].D., BCPS, AAHIVP []  Estella Husk, Pharm.D., BCPS, AAHIVP []  Lysle Pearl, PharmD, BCPS []  Phillips Climes, PharmD, BCPS []  Agapito Games, PharmD, BCPS []  Verlan Friends, PharmD []  Mervyn Gay, PharmD, BCPS []  Vinnie Level, PharmD  [facility] []  Len Childs, PharmD []  Greer Pickerel, PharmD []  Adalberto Cole, PharmD []  Perlie Gold, Rph []  Lonell Face) Jean Rosenthal, PharmD []  Earl Many, PharmD []  Junita Push, PharmD []  Dorna Leitz, PharmD []  Terrilee Files, PharmD []  Lynann Beaver, PharmD []  Keturah Barre, PharmD []  Loralee Pacas, PharmD []  Bernadene Person, PharmD   Positive urine culture Treated with Doxycycline and Metronidazole, organism sensitive to the same and no further patient follow-up is required at this time.  Sandria Senter 02/27/2023, 11:36 AM

## 2023-03-17 ENCOUNTER — Emergency Department (HOSPITAL_COMMUNITY): Payer: Medicaid Other

## 2023-03-17 ENCOUNTER — Encounter (HOSPITAL_COMMUNITY): Payer: Self-pay

## 2023-03-17 ENCOUNTER — Other Ambulatory Visit: Payer: Self-pay

## 2023-03-17 ENCOUNTER — Emergency Department (HOSPITAL_COMMUNITY)
Admission: EM | Admit: 2023-03-17 | Discharge: 2023-03-17 | Disposition: A | Discharge status: Home / Self Care | Payer: Medicaid Other | Attending: Emergency Medicine | Admitting: Emergency Medicine

## 2023-03-17 DIAGNOSIS — M7918 Myalgia, other site: Secondary | ICD-10-CM

## 2023-03-17 DIAGNOSIS — M533 Sacrococcygeal disorders, not elsewhere classified: Secondary | ICD-10-CM | POA: Insufficient documentation

## 2023-03-17 LAB — POC URINE PREG, ED: Preg Test, Ur: NEGATIVE

## 2023-03-17 MED ORDER — HYDROCODONE-ACETAMINOPHEN 5-325 MG PO TABS
1.0000 | ORAL_TABLET | Freq: Once | ORAL | Status: AC
  Administered 2023-03-17: 1 via ORAL
  Filled 2023-03-17: qty 1

## 2023-03-17 MED ORDER — DOXYCYCLINE HYCLATE 100 MG PO CAPS: 100.0000 mg | ORAL_CAPSULE | Freq: Two times a day (BID) | ORAL | 0 refills | Status: DC

## 2023-03-17 MED ORDER — HYDROCODONE-ACETAMINOPHEN 5-325 MG PO TABS: 1.0000 | ORAL_TABLET | ORAL | 0 refills | Status: DC | PRN

## 2023-03-17 NOTE — ED Provider Notes (Signed)
  [place] EMERGENCY DEPARTMENT AT [facility] Provider Note   CSN: [id number] Arrival date & time: 03/17/23  1142     History {Add pertinent medical, surgical, social history, OB history to HPI:1} Chief Complaint  Patient presents with   Tailbone Pain    Martha Howe is a 42 y.o. female.  The history is provided by the patient.       Home Medications Prior to Admission medications   Medication Sig Start Date End Date Taking? Authorizing Provider  doxycycline (VIBRAMYCIN) 100 MG capsule Take 1 capsule (100 mg total) by mouth 2 (two) times daily. 02/24/23   Triplett, Tammy, PA-C  ibuprofen (ADVIL) 800 MG tablet Take 1 tablet (800 mg total) by mouth 3 (three) times daily. 02/04/23   Leath-Warren, Sadie Haber, NP  potassium chloride SA (KLOR-CON M) 20 MEQ tablet Take 1 tablet (20 mEq total) by mouth 2 (two) times daily. 02/24/23   Triplett, Tammy, PA-C  Prenatal Vit-Fe Fumarate-FA (PRENATAL MULTIVITAMIN) TABS tablet Take 1 tablet by mouth daily at 12 noon.    [provider]      Allergies    Patient has no known allergies.    Review of Systems   Review of Systems  Physical Exam Updated Vital Signs BP 130/82 (BP Location: Right Arm)   Pulse 91   Temp 98.1 F (36.7 C) (Oral)   Resp 16   Ht 5\' 6"  (1.676 m)   Wt 90.7 kg   LMP  (LMP Unknown) Comment: Patient taking Depo Provera Injections  SpO2 100%   BMI 32.28 kg/m  Physical Exam  ED Results / Procedures / Treatments   Labs (all labs ordered are listed, but only abnormal results are displayed) Labs Reviewed  POC URINE PREG, ED    EKG None  Radiology DG Sacrum/Coccyx  Result Date: 03/17/2023 CLINICAL DATA:  Pain in tailbone.  No trauma. EXAM: SACRUM AND COCCYX - 2+ VIEW COMPARISON:  None Available. FINDINGS: There is no evidence of fracture or other focal bone lesions. IMPRESSION: Negative. Electronically Signed   By: Gerome Sam III M.D.   On: 03/17/2023 14:20    Procedures Procedures   {Document cardiac monitor, telemetry assessment procedure when appropriate:1}  Medications Ordered in ED Medications  HYDROcodone-acetaminophen (NORCO/VICODIN) 5-325 MG per tablet 1 tablet (1 tablet Oral Given 03/17/23 1317)    ED Course/ Medical Decision Making/ A&P   {   Click here for ABCD2, HEART and other calculatorsREFRESH Note before signing :1}                          Medical Decision Making Amount and/or Complexity of Data Reviewed Radiology: ordered.  Risk Prescription drug management.   ***  {Document critical care time when appropriate:1} {Document review of labs and clinical decision tools ie heart score, Chads2Vasc2 etc:1}  {Document your independent review of radiology images, and any outside records:1} {Document your discussion with family members, caretakers, and with consultants:1} {Document social determinants of health affecting pt's care:1} {Document your decision making why or why not admission, treatments were needed:1} Final Clinical Impression(s) / ED Diagnoses Final diagnoses:  None    Rx / DC Orders ED Discharge Orders     None

## 2023-03-17 NOTE — ED Triage Notes (Signed)
Patient present to ED via POV, c/o tailbone hurting. Pain started last night, reports a constant pain. OTC ibuprofen 800 mg not effective. Denies falling or injury to coccyx area. No acute distress noted

## 2023-03-17 NOTE — Discharge Instructions (Signed)
Your imaging and exam are normal today but I suspect you may be trying to develop an early skin infection like prior.  There is no indication of needing abscess drainage at this time.  I recommend warm water/tub soaks twice daily and completing the entire course of the antibiotics.  You may take the hydrocodone prescribed for pain relief.  This will make you drowsy - do not drive within 4 hours of taking this medication.

## 2023-05-25 ENCOUNTER — Emergency Department (HOSPITAL_COMMUNITY)
Admission: EM | Admit: 2023-05-25 | Discharge: 2023-05-25 | Disposition: A | Discharge status: Home / Self Care | Payer: Medicaid Other | Attending: Emergency Medicine | Admitting: Emergency Medicine

## 2023-05-25 ENCOUNTER — Other Ambulatory Visit: Payer: Self-pay

## 2023-05-25 ENCOUNTER — Encounter (HOSPITAL_COMMUNITY): Payer: Self-pay

## 2023-05-25 DIAGNOSIS — R2243 Localized swelling, mass and lump, lower limb, bilateral: Secondary | ICD-10-CM | POA: Insufficient documentation

## 2023-05-25 DIAGNOSIS — G43909 Migraine, unspecified, not intractable, without status migrainosus: Secondary | ICD-10-CM | POA: Insufficient documentation

## 2023-05-25 DIAGNOSIS — J069 Acute upper respiratory infection, unspecified: Secondary | ICD-10-CM | POA: Diagnosis not present

## 2023-05-25 DIAGNOSIS — Z1152 Encounter for screening for COVID-19: Secondary | ICD-10-CM | POA: Diagnosis not present

## 2023-05-25 DIAGNOSIS — M791 Myalgia, unspecified site: Secondary | ICD-10-CM | POA: Insufficient documentation

## 2023-05-25 DIAGNOSIS — R519 Headache, unspecified: Secondary | ICD-10-CM

## 2023-05-25 DIAGNOSIS — R6883 Chills (without fever): Secondary | ICD-10-CM | POA: Diagnosis present

## 2023-05-25 LAB — BASIC METABOLIC PANEL
Anion gap: 9 (ref 5–15)
BUN: 8 mg/dL (ref 6–20)
CO2: 23 mmol/L (ref 22–32)
Calcium: 8.4 mg/dL — ABNORMAL LOW (ref 8.9–10.3)
Chloride: 103 mmol/L (ref 98–111)
Creatinine, Ser: 0.77 mg/dL (ref 0.44–1.00)
GFR, Estimated: >60 mL/min (ref >60)
Glucose, Bld: 96 mg/dL (ref 70–99)
Potassium: 3.3 mmol/L — ABNORMAL LOW (ref 3.5–5.1)
Sodium: 135 mmol/L (ref 135–145)

## 2023-05-25 LAB — CBC WITH DIFFERENTIAL/PLATELET
Abs Immature Granulocytes: 0.05 10*3/uL (ref 0.00–0.07)
Basophils Absolute: 0.1 10*3/uL (ref 0.0–0.1)
Basophils Relative: 1 %
Eosinophils Absolute: 0.2 10*3/uL (ref 0.0–0.5)
Eosinophils Relative: 1 %
HCT: 41.8 % (ref 36.0–46.0)
Hemoglobin: 14.2 g/dL (ref 12.0–15.0)
Immature Granulocytes: 0 %
Lymphocytes Relative: 9 %
Lymphs Abs: 1.2 10*3/uL (ref 0.7–4.0)
MCH: 29.3 pg (ref 26.0–34.0)
MCHC: 34 g/dL (ref 30.0–36.0)
MCV: 86.4 fL (ref 80.0–100.0)
Monocytes Absolute: 1.5 10*3/uL — ABNORMAL HIGH (ref 0.1–1.0)
Monocytes Relative: 12 %
Neutro Abs: 10 10*3/uL — ABNORMAL HIGH (ref 1.7–7.7)
Neutrophils Relative %: 77 %
Platelets: 231 10*3/uL (ref 150–400)
RBC: 4.84 MIL/uL (ref 3.87–5.11)
RDW: 14.5 % (ref 11.5–15.5)
WBC: 13 10*3/uL — ABNORMAL HIGH (ref 4.0–10.5)
nRBC: 0 % (ref 0.0–0.2)

## 2023-05-25 LAB — URINALYSIS, ROUTINE W REFLEX MICROSCOPIC
Bacteria, UA: NONE SEEN
Bilirubin Urine: NEGATIVE
Glucose, UA: NEGATIVE mg/dL
Ketones, ur: NEGATIVE mg/dL
Leukocytes,Ua: NEGATIVE
Nitrite: NEGATIVE
Protein, ur: NEGATIVE mg/dL
Specific Gravity, Urine: 1.024 (ref 1.005–1.030)
pH: 6 (ref 5.0–8.0)

## 2023-05-25 LAB — RESP PANEL BY RT-PCR (RSV, FLU A&B, COVID)  RVPGX2
Influenza A by PCR: NEGATIVE
Influenza B by PCR: NEGATIVE
Resp Syncytial Virus by PCR: NEGATIVE
SARS Coronavirus 2 by RT PCR: NEGATIVE

## 2023-05-25 LAB — PREGNANCY, URINE: Preg Test, Ur: NEGATIVE

## 2023-05-25 MED ORDER — METOCLOPRAMIDE HCL 5 MG/ML IJ SOLN
10.0000 mg | Freq: Once | INTRAMUSCULAR | Status: AC
  Administered 2023-05-25: 10 mg via INTRAVENOUS
  Filled 2023-05-25: qty 2

## 2023-05-25 MED ORDER — KETOROLAC TROMETHAMINE 30 MG/ML IJ SOLN
30.0000 mg | Freq: Once | INTRAMUSCULAR | Status: AC
  Administered 2023-05-25: 30 mg via INTRAVENOUS
  Filled 2023-05-25: qty 1

## 2023-05-25 MED ORDER — IBUPROFEN 600 MG PO TABS: 600.0000 mg | ORAL_TABLET | Freq: Four times a day (QID) | ORAL | 0 refills | Status: DC | PRN

## 2023-05-25 MED ORDER — BENZONATATE 200 MG PO CAPS: 200.0000 mg | ORAL_CAPSULE | Freq: Three times a day (TID) | ORAL | 0 refills | Status: DC | PRN

## 2023-05-25 MED ORDER — POTASSIUM CHLORIDE CRYS ER 20 MEQ PO TBCR
40.0000 meq | EXTENDED_RELEASE_TABLET | Freq: Once | ORAL | Status: AC
  Administered 2023-05-25: 40 meq via ORAL
  Filled 2023-05-25: qty 2

## 2023-05-25 MED ORDER — DIPHENHYDRAMINE HCL 25 MG PO CAPS
25.0000 mg | ORAL_CAPSULE | Freq: Once | ORAL | Status: AC
  Administered 2023-05-25: 25 mg via ORAL
  Filled 2023-05-25: qty 1

## 2023-05-25 MED ORDER — SODIUM CHLORIDE 0.9 % IV BOLUS
1000.0000 mL | Freq: Once | INTRAVENOUS | Status: AC
  Administered 2023-05-25: 1000 mL via INTRAVENOUS

## 2023-05-25 NOTE — ED Provider Notes (Signed)
[place] EMERGENCY DEPARTMENT AT [facility] Provider Note   CSN: [id number] Arrival date & time: 05/25/23  1027     History  Chief Complaint  Patient presents with   Migraine        bodyache   Chills    Martha Howe is a 42 y.o. female.   Migraine Associated symptoms include headaches. Pertinent negatives include no chest pain, no abdominal pain and no shortness of breath.       Martha Howe is a 42 y.o. female who presents to the Emergency Department complaining of bilateral lower leg cramping and pain, frontal headache, chills, cough and sore throat.  Symptoms began yesterday, worse today.  Cough mostly nonproductive.  Denies chest pain or shortness of breath.  Symptoms began as sore throat.  Headache of gradual onset feels like throbbing sensation to her forehead and into her temples.  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    Home Medications Prior to Admission medications   Medication Sig Start Date End Date Taking? Authorizing Provider  doxycycline (VIBRAMYCIN) 100 MG capsule Take 1 capsule (100 mg total) by mouth 2 (two) times daily. 03/17/23   Burgess Amor, PA-C  HYDROcodone-acetaminophen (NORCO/VICODIN) 5-325 MG tablet Take 1 tablet by mouth every 4 (four) hours as needed. 03/17/23   Burgess Amor, PA-C  ibuprofen (ADVIL) 800 MG tablet Take 1 tablet (800 mg total) by mouth 3 (three) times daily. 02/04/23   Leath-Warren, Sadie Haber, NP  potassium chloride SA (KLOR-CON M) 20 MEQ tablet Take 1 tablet (20 mEq total) by mouth 2 (two) times daily. 02/24/23   Zakariyya Helfman, PA-C  Prenatal Vit-Fe Fumarate-FA (PRENATAL MULTIVITAMIN) TABS tablet Take 1 tablet by mouth daily at 12 noon.    [provider]      Allergies    Patient has no known allergies.    Review of Systems   Review of Systems  Constitutional:  Positive for appetite change and chills. Negative for fever.  HENT:  Positive for sore throat.    Eyes:  Negative for visual disturbance.  Respiratory:  Positive for cough. Negative for shortness of breath.   Cardiovascular:  Negative for chest pain.  Gastrointestinal:  Negative for abdominal pain, nausea and vomiting.  Genitourinary:  Negative for dysuria and flank pain.  Musculoskeletal:  Positive for myalgias. Negative for neck pain and neck stiffness.  Skin:  Negative for rash.  Neurological:  Positive for headaches. Negative for syncope and weakness.    Physical Exam Updated Vital Signs BP (!) 137/96   Pulse (!) 112   Temp 98.5 F (36.9 C) (Oral)   Ht 5\' 6"  (1.676 m)   Wt 88.5 kg   SpO2 99%   BMI 31.47 kg/m  Physical Exam Vitals and nursing note reviewed.  Constitutional:      General: She is not in acute distress.    Appearance: Normal appearance. She is not ill-appearing or toxic-appearing.  HENT:     Right Ear: Tympanic membrane and ear canal normal.     Left Ear: Tympanic membrane and ear canal normal.     Mouth/Throat:     Mouth: Mucous membranes are moist.     Pharynx: Oropharynx is clear. No oropharyngeal exudate or posterior oropharyngeal erythema.  Neck:     Meningeal: Kernig's sign absent.  Cardiovascular:     Rate and Rhythm: Normal rate and regular rhythm.     Pulses: Normal pulses.  Pulmonary:     Effort: Pulmonary effort is normal. No respiratory distress.     Breath sounds: No wheezing.  Chest:     Chest wall: No tenderness.  Abdominal:     Palpations: Abdomen is soft.     Tenderness: There is no abdominal tenderness.  Musculoskeletal:        General: Normal range of motion.     Cervical back: Full passive range of motion without pain. No rigidity.  Lymphadenopathy:     Cervical: No cervical adenopathy.  Skin:    General: Skin is warm.     Capillary Refill: Capillary refill takes less than 2 seconds.     Findings: No rash.  Neurological:     General: No focal deficit present.     Mental Status: She is alert.     Sensory: No sensory  deficit.     Motor: No weakness.     ED Results / Procedures / Treatments   Labs (all labs ordered are listed, but only abnormal results are displayed) Labs Reviewed  CBC WITH DIFFERENTIAL/PLATELET - Abnormal; Notable for the following components:      Result Value   WBC 13.0 (*)    Neutro Abs 10.0 (*)    Monocytes Absolute 1.5 (*)    All other components within normal limits  BASIC METABOLIC PANEL - Abnormal; Notable for the following components:   Potassium 3.3 (*)    Calcium 8.4 (*)    All other components within normal limits  URINALYSIS, ROUTINE W REFLEX MICROSCOPIC - Abnormal; Notable for the following components:   Hgb urine dipstick MODERATE (*)    All other components within normal limits  RESP PANEL BY RT-PCR (RSV, FLU A&B, COVID)  RVPGX2  PREGNANCY, URINE    EKG None  Radiology No results found.  Procedures Procedures    Medications Ordered in ED Medications  ketorolac (TORADOL) 30 MG/ML injection 30 mg (has no administration in time range)  diphenhydrAMINE (BENADRYL) capsule 25 mg (has no administration in time range)  metoCLOPramide (REGLAN) injection 10 mg (has no administration in time range)  sodium chloride 0.9 % bolus 1,000 mL (has no administration in time range)    ED Course/ Medical Decision Making/ A&P                             Medical Decision Making Patient here with likely viral process.  Headache of gradual onset.  No nuchal rigidity.  No focal neurodeficits on exam.  I suspect viral process, headache with associated URI symptoms.  Bacterial process, meningitis also considered but felt less likely.  Amount and/or Complexity of Data Reviewed Labs: ordered.    Details: Labs interpreted by me, COVID flu and RSV negative, urine without evidence of infection, pregnancy test negative, chemistries without significant derangement, no significant leukocytosis, Discussion of management or test interpretation with external provider(s):    Patient here with likely viral URI, no concerning symptoms for emergent process.  Feeling better after medications given here.  Headache improved.  Vital signs reassuring.  Appears appropriate for discharge home, all questions were answered  Risk Prescription drug management.           Final Clinical Impression(s) / ED Diagnoses Final diagnoses:  Bad headache  Upper respiratory tract infection, unspecified type    Rx / DC Orders ED Discharge Orders     None         Pauline Aus, PA-C 05/28/23  1603    Rondel Baton, MD 05/30/23 [phone]

## 2023-05-25 NOTE — ED Notes (Signed)
Pt states she did 2 covid tests at home and both were negaitve

## 2023-05-25 NOTE — Discharge Instructions (Signed)
Take medication as directed.  Drink plenty of fluids.  Please follow-up with your primary care provider for recheck if needed.  Return emergency department for any new or worsening symptoms.

## 2023-05-25 NOTE — ED Triage Notes (Signed)
C/o body aches, chills, cough, and headache/migraine for at least last 24 hrs per pt. Temp is 98.5 in ED.

## 2024-11-19 ENCOUNTER — Ambulatory Visit
Admission: EM | Admit: 2024-11-19 | Discharge: 2024-11-19 | Disposition: A | Discharge status: Home / Self Care | Payer: Self-pay | Source: Home / Self Care

## 2024-11-19 DIAGNOSIS — J029 Acute pharyngitis, unspecified: Secondary | ICD-10-CM | POA: Insufficient documentation

## 2024-11-19 LAB — POCT RAPID STREP A (OFFICE): Rapid Strep A Screen: NEGATIVE

## 2024-11-19 MED ORDER — LIDOCAINE VISCOUS HCL 2 % MT SOLN: 10.0000 mL | OROMUCOSAL | 0 refills | Status: AC | PRN

## 2024-11-19 NOTE — ED Triage Notes (Signed)
 Pt reports sore throat, x 1 weeks. Has tried OTC meds but has found no relief.

## 2024-11-19 NOTE — ED Provider Notes (Signed)
 " [facility]    CSN: [id number] Arrival date & time: 11/19/24  9170      History   Chief Complaint No chief complaint on file.   HPI Martha Howe is a 43 y.o. female.   Patient presenting today with 1 week history of persistent sore throat.  Had some congestion initially but that seems to have started clearing up.  Denies cough, chest pain, shortness of breath, vomiting, diarrhea, rashes.  So far trying over-the-counter cold and congestion medication with mild relief.    History reviewed. No pertinent past medical history.  There are no active problems to display for this patient.   History reviewed. No pertinent surgical history.  OB History     Gravida  1   Para      Term      Preterm      AB      Living         SAB      IAB      Ectopic      Multiple      Live Births               Home Medications    Prior to Admission medications  Medication Sig Start Date End Date Taking? Authorizing Provider  lidocaine  (XYLOCAINE ) 2 % solution Use as directed 10 mLs in the mouth or throat every 3 (three) hours as needed. 11/19/24  Yes Stuart Vernell Norris, PA-C    Family History History reviewed. No pertinent family history.  Social History Social History[1]   Allergies   Patient has no known allergies.   Review of Systems Review of Systems PER HPI  Physical Exam Triage Vital Signs ED Triage Vitals  Encounter Vitals Group     BP 11/19/24 0840 132/87     Girls Systolic BP Percentile --      Girls Diastolic BP Percentile --      Boys Systolic BP Percentile --      Boys Diastolic BP Percentile --      Pulse Rate 11/19/24 0840 93     Resp 11/19/24 0840 18     Temp 11/19/24 0840 97.9 F (36.6 C)     Temp Source 11/19/24 0840 Oral     SpO2 11/19/24 0840 97 %     Weight --      Height --      Head Circumference --      Peak Flow --      Pain Score 11/19/24 0843 0     Pain Loc --      Pain Education --      Exclude  from Growth Chart --    No data found.  Updated Vital Signs BP 132/87 (BP Location: Right Arm)   Pulse 93   Temp 97.9 F (36.6 C) (Oral)   Resp 18   LMP 10/29/2024 (Within Weeks)   SpO2 97%   Breastfeeding No   Visual Acuity Right Eye Distance:   Left Eye Distance:   Bilateral Distance:    Right Eye Near:   Left Eye Near:    Bilateral Near:     Physical Exam Vitals and nursing note reviewed.  Constitutional:      Appearance: Normal appearance.  HENT:     Head: Atraumatic.     Right Ear: Tympanic membrane and external ear normal.     Left Ear: Tympanic membrane and external ear normal.     Nose: Nose normal.  Mouth/Throat:     Mouth: Mucous membranes are moist.     Pharynx: Posterior oropharyngeal erythema present. No oropharyngeal exudate.  Eyes:     Extraocular Movements: Extraocular movements intact.     Conjunctiva/sclera: Conjunctivae normal.  Cardiovascular:     Rate and Rhythm: Normal rate and regular rhythm.     Heart sounds: Normal heart sounds.  Pulmonary:     Effort: Pulmonary effort is normal.     Breath sounds: Normal breath sounds. No wheezing or rales.  Musculoskeletal:        General: Normal range of motion.     Cervical back: Normal range of motion and neck supple.  Lymphadenopathy:     Cervical: No cervical adenopathy.  Skin:    General: Skin is warm and dry.  Neurological:     Mental Status: She is alert and oriented to person, place, and time.  Psychiatric:        Mood and Affect: Mood normal.        Thought Content: Thought content normal.      UC Treatments / Results  Labs (all labs ordered are listed, but only abnormal results are displayed) Labs Reviewed  CULTURE, GROUP A STREP [facility])  POCT RAPID STREP A (OFFICE)    EKG   Radiology No results found.  Procedures Procedures (including critical care time)  Medications Ordered in UC Medications - No data to display  Initial Impression / Assessment and Plan / UC  Course  I have reviewed the triage vital signs and the nursing notes.  Pertinent labs & imaging results that were available during my care of the patient were reviewed by me and considered in my medical decision making (see chart for details).     Rapid strep negative, throat culture pending.  Vitals and exam reassuring today.  Treat with viscous lidocaine , support over-the-counter medications and home care.  Return for worsening or unresolving symptoms.  Final Clinical Impressions(s) / UC Diagnoses   Final diagnoses:  Acute pharyngitis, unspecified etiology     Discharge Instructions      Your rapid strep test was negative.  I have sent out a culture to be sure there are no other types of bacteria isolated but based on your exam and testing today I do suspect a viral illness to be causing your ongoing sore throat.  I have sent over a numbing liquid for you to gargle and spit to help with pain as needed in addition to over-the-counter remedies as discussed.  Follow-up for worsening or unresolving symptoms.    ED Prescriptions     Medication Sig Dispense Auth. Provider   lidocaine  (XYLOCAINE ) 2 % solution Use as directed 10 mLs in the mouth or throat every 3 (three) hours as needed. 100 mL Stuart Vernell Norris, [place]      PDMP not reviewed this encounter.    [1]  Social History Tobacco Use   Smoking status: Every Day    Types: Cigarettes   Smokeless tobacco: Never  Vaping Use   Vaping status: Never Used  Substance Use Topics   Alcohol use: Yes    Comment: occ   Drug use: Yes    Frequency: 4.0 times per week    Types: Marijuana     Stuart Vernell Norris, [place] 11/19/24 9043  "

## 2024-11-19 NOTE — Discharge Instructions (Addendum)
 Your rapid strep test was negative.  I have sent out a culture to be sure there are no other types of bacteria isolated but based on your exam and testing today I do suspect a viral illness to be causing your ongoing sore throat.  I have sent over a numbing liquid for you to gargle and spit to help with pain as needed in addition to over-the-counter remedies as discussed.  Follow-up for worsening or unresolving symptoms.

## 2024-11-22 LAB — CULTURE, GROUP A STREP (THRC)

## 2024-11-24 ENCOUNTER — Ambulatory Visit (HOSPITAL_COMMUNITY): Payer: Self-pay
